# Patient Record
Sex: Female | Born: 1935 | Race: White | Hispanic: No | Smoking: Former smoker
Health system: Southern US, Community
[De-identification: ages and names within clinical notes are randomized; demographics above are authoritative.]

## PROBLEM LIST (undated history)

## (undated) DIAGNOSIS — H8109 Meniere's disease, unspecified ear: Secondary | ICD-10-CM

## (undated) DIAGNOSIS — E785 Hyperlipidemia, unspecified: Secondary | ICD-10-CM

## (undated) DIAGNOSIS — F0280 Dementia in other diseases classified elsewhere without behavioral disturbance: Secondary | ICD-10-CM

## (undated) DIAGNOSIS — G309 Alzheimer's disease, unspecified: Secondary | ICD-10-CM

## (undated) DIAGNOSIS — I4891 Unspecified atrial fibrillation: Secondary | ICD-10-CM

## (undated) DIAGNOSIS — I10 Essential (primary) hypertension: Secondary | ICD-10-CM

## (undated) DIAGNOSIS — K449 Diaphragmatic hernia without obstruction or gangrene: Secondary | ICD-10-CM

## (undated) DIAGNOSIS — D509 Iron deficiency anemia, unspecified: Secondary | ICD-10-CM

## (undated) DIAGNOSIS — K219 Gastro-esophageal reflux disease without esophagitis: Secondary | ICD-10-CM

## (undated) DIAGNOSIS — I509 Heart failure, unspecified: Secondary | ICD-10-CM

## (undated) HISTORY — PX: MASTECTOMY: SHX3

---

## 2004-04-30 ENCOUNTER — Ambulatory Visit: Payer: Self-pay | Admitting: Gastroenterology

## 2004-07-22 ENCOUNTER — Ambulatory Visit: Payer: Self-pay | Admitting: Cardiology

## 2004-11-12 ENCOUNTER — Ambulatory Visit: Payer: Self-pay | Admitting: Internal Medicine

## 2005-08-06 ENCOUNTER — Emergency Department: Payer: Self-pay | Admitting: Emergency Medicine

## 2005-08-06 ENCOUNTER — Other Ambulatory Visit: Payer: Self-pay

## 2005-10-05 ENCOUNTER — Ambulatory Visit: Payer: Self-pay | Admitting: Unknown Physician Specialty

## 2005-10-21 ENCOUNTER — Other Ambulatory Visit: Payer: Self-pay

## 2005-10-28 ENCOUNTER — Inpatient Hospital Stay: Payer: Self-pay | Admitting: Unknown Physician Specialty

## 2005-12-27 ENCOUNTER — Ambulatory Visit: Payer: Self-pay | Admitting: Internal Medicine

## 2007-06-20 ENCOUNTER — Ambulatory Visit: Payer: Self-pay | Admitting: Internal Medicine

## 2007-08-16 IMAGING — CR DG LUMBAR SPINE 2-3V
1 series · 3 of 3 positions shown · non-contrast
Comparison: none

REASON FOR EXAM: INJURY
COMMENTS:  LMP: Post-Menopausal

[Series 1: view not recorded · 0.17mm/px · 3 of 3 slices shown]
[im 1/3]
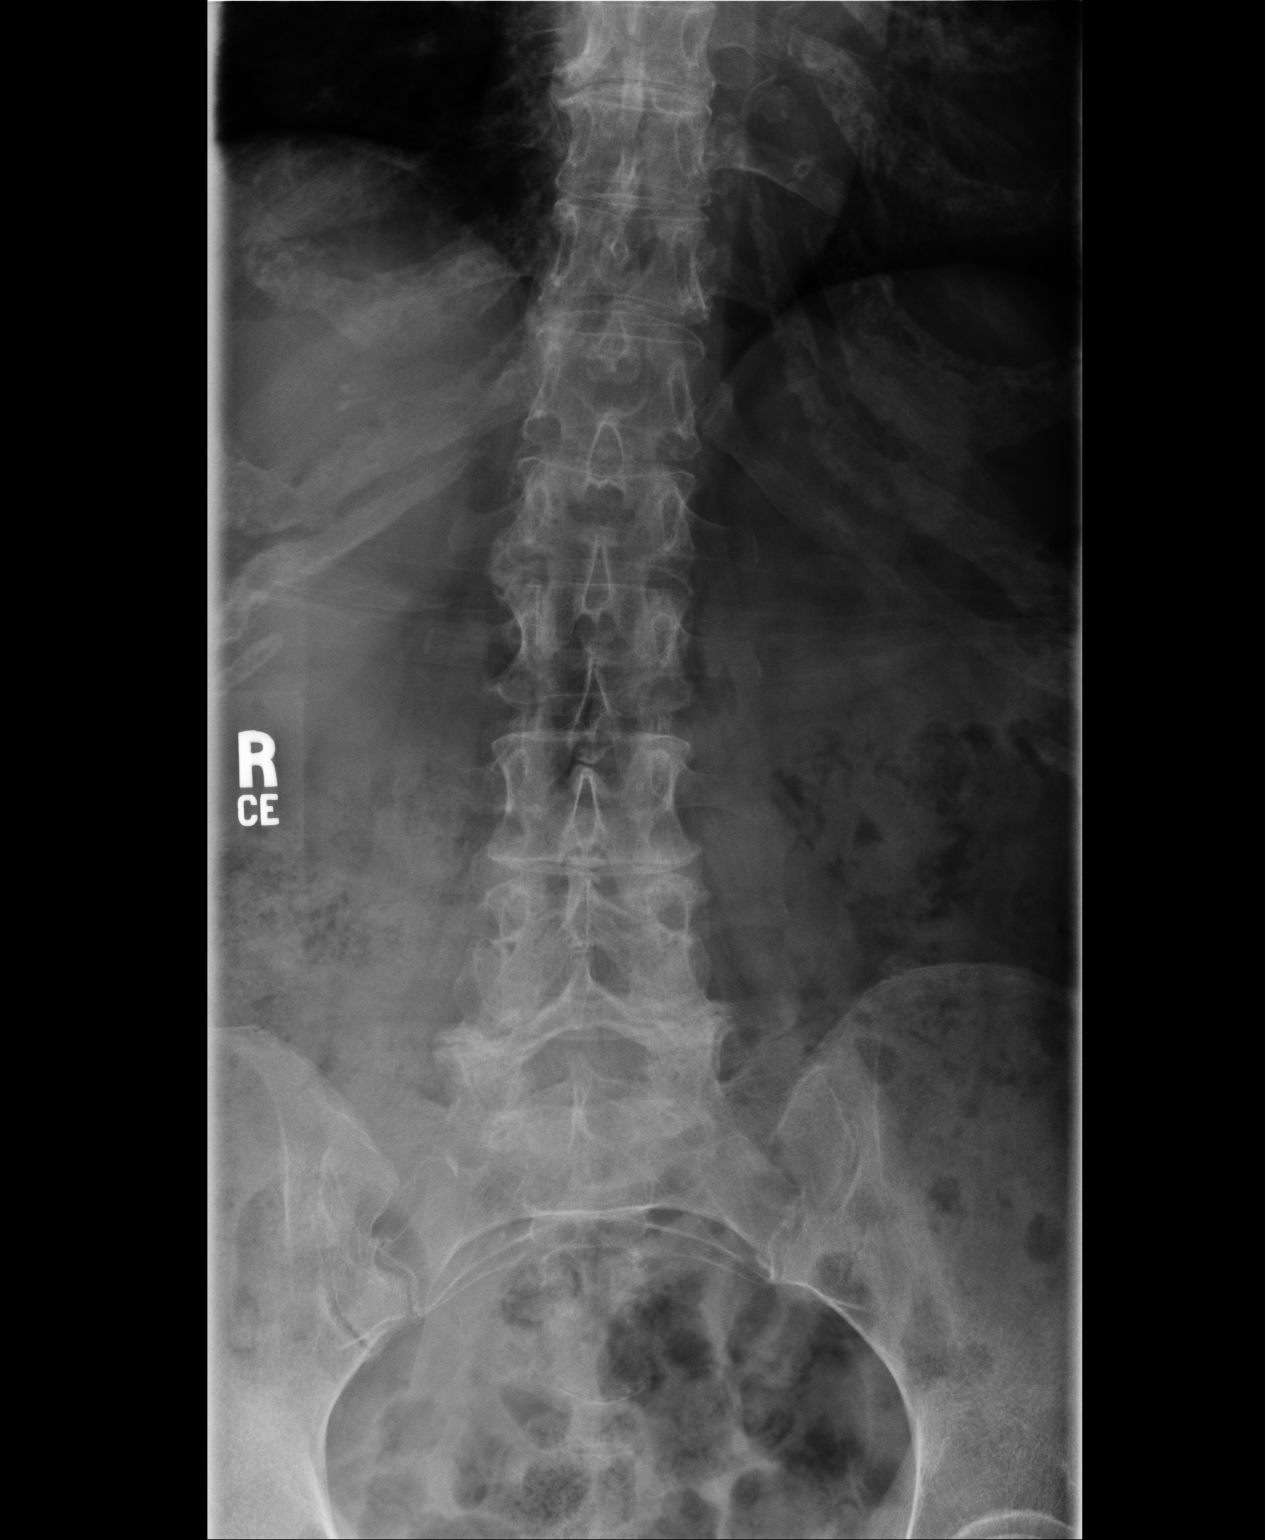
[im 2/3]
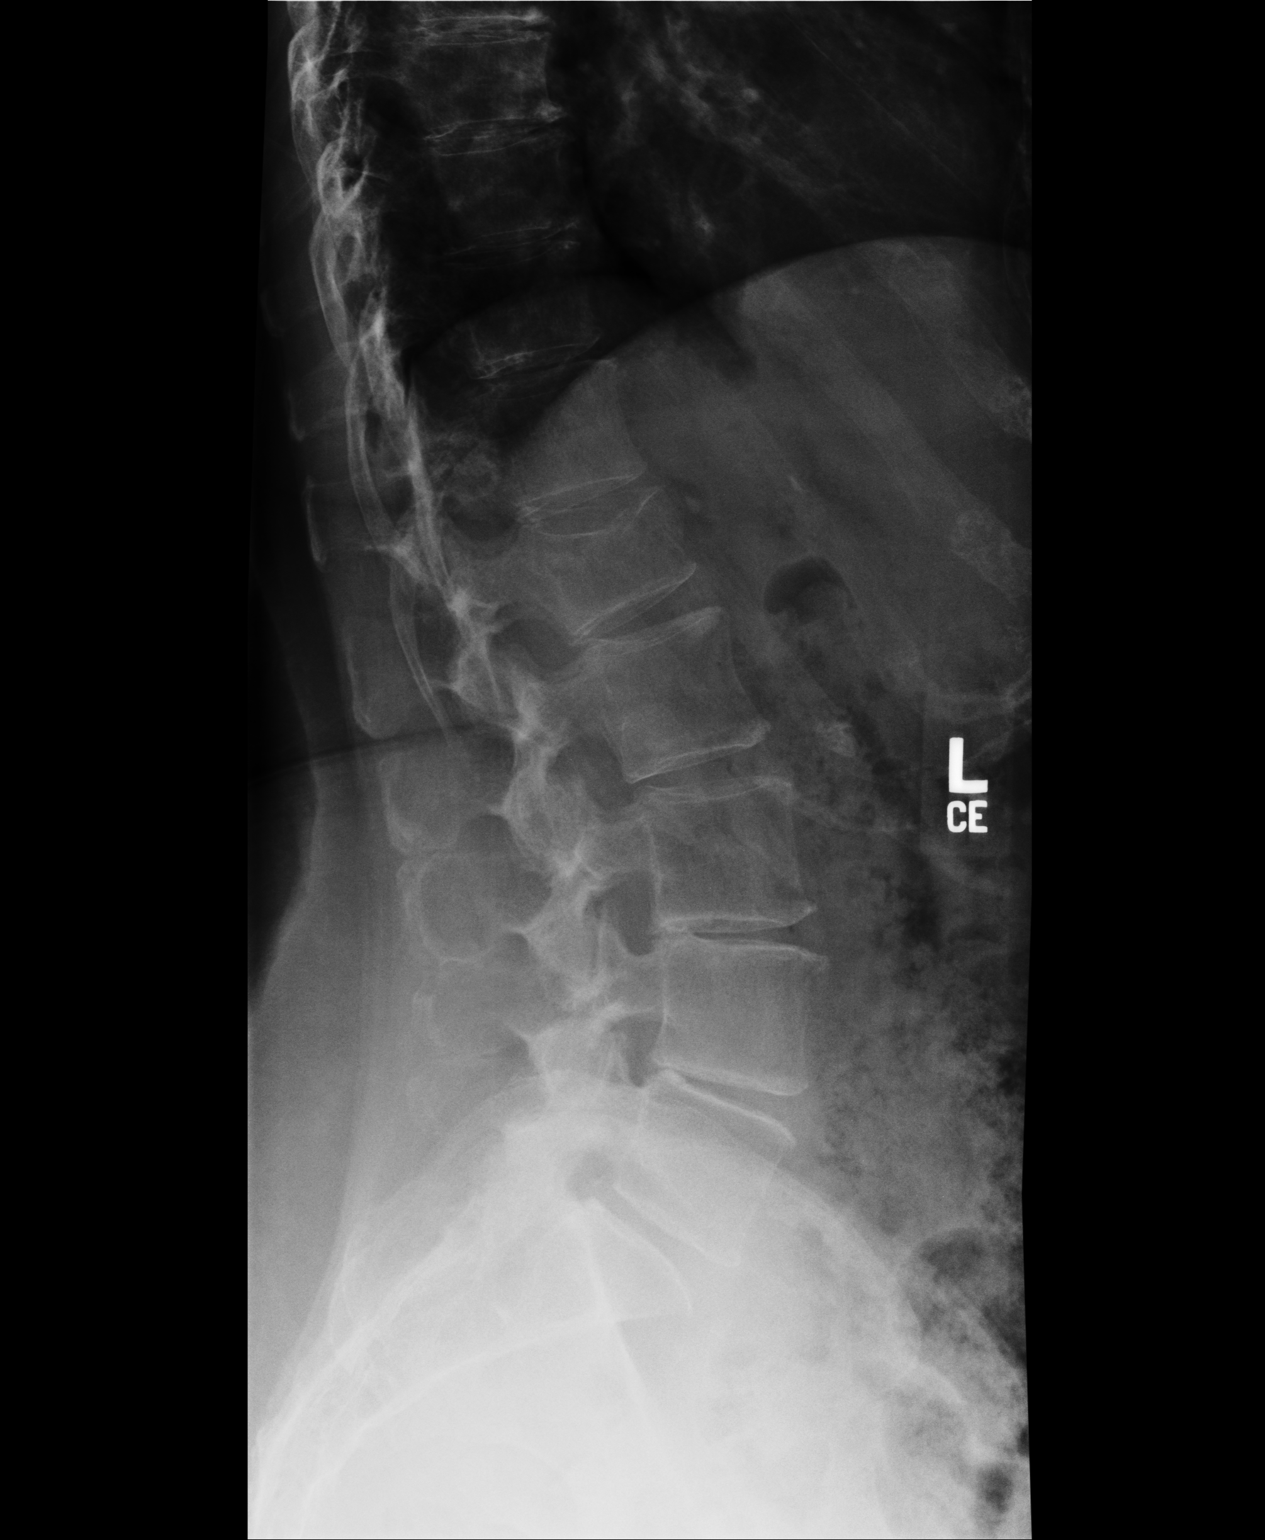
[im 3/3]
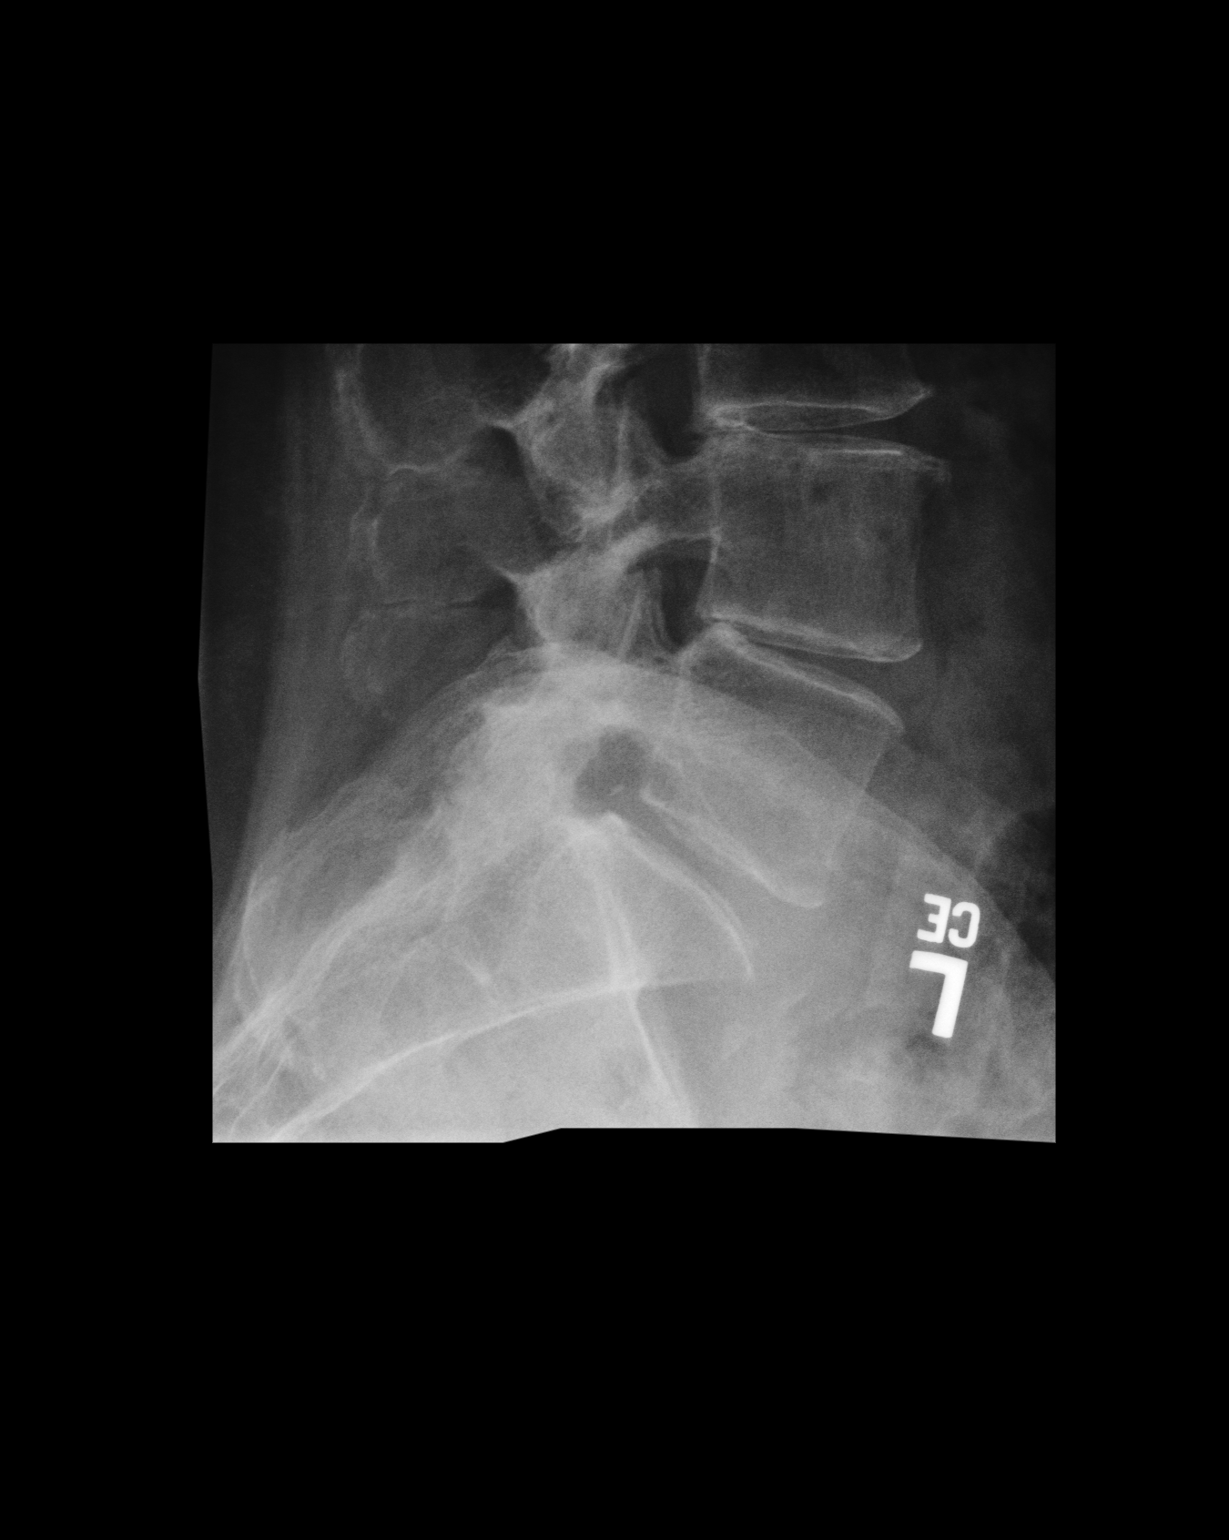

[3 of 3 positions shown; findings below may reference images not displayed]

PROCEDURE:     DXR - DXR LUMBAR SPINE AP AND LATERAL  - August 06, 2005  [DATE]

RESULT:        AP and lateral views of the lumbar spine show a 20% central
and anterior vertebral compression deformity of L1.  The vertebral body
heights otherwise are well maintained.  There is narrowing of the L3-L4,
L4-L5 intervertebral disc spaces consistent with disc disease.  The
vertebral body alignment is normal.  The pedicles are bilaterally intact.
IMPRESSION: 1.     There is a 20% vertebral compression fracture deformity of L1.  The
age of this is not certain but could be acute.
2.     No other area suspicious for fracture are seen.
3.     There is narrowing of the L3-L4 and L4-L5 intervertebral disc spaces
consistent with disc disease.

## 2007-10-31 IMAGING — CR DG CHEST 2V
1 series · 2 of 2 positions shown · non-contrast
Comparison: none

REASON FOR EXAM: HTN
COMMENTS:

PROCEDURE:     DXR - DXR CHEST PA (OR AP) AND LATERAL  - October 21, 2005 [DATE]
RESULT:       The lung fields are clear.  No pneumonia, pneumothorax or
pleural effusion is seen.  The heart is upper limits for normal in size or
slightly enlarged.  No acute bony abnormalities are seen.

[Series 1: view not recorded · 0.17mm/px · 2 of 2 slices shown]
[im 1/2]
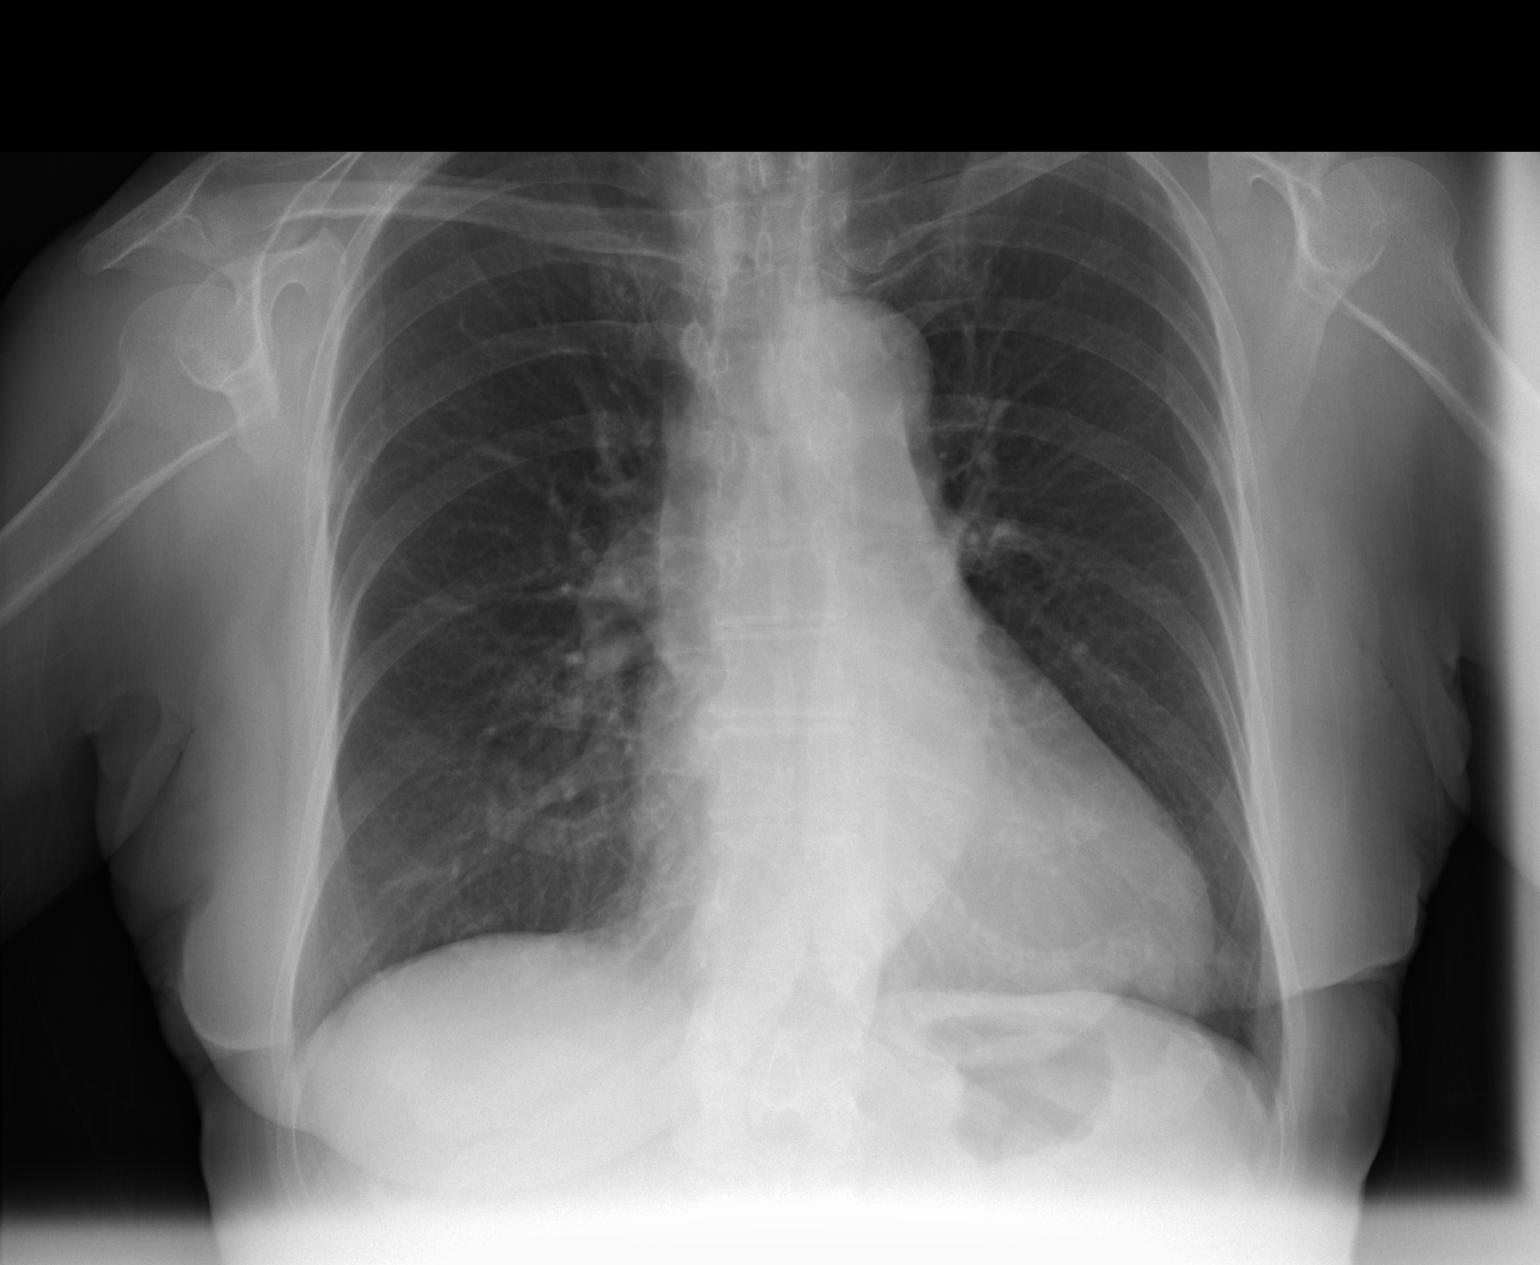
[im 2/2]
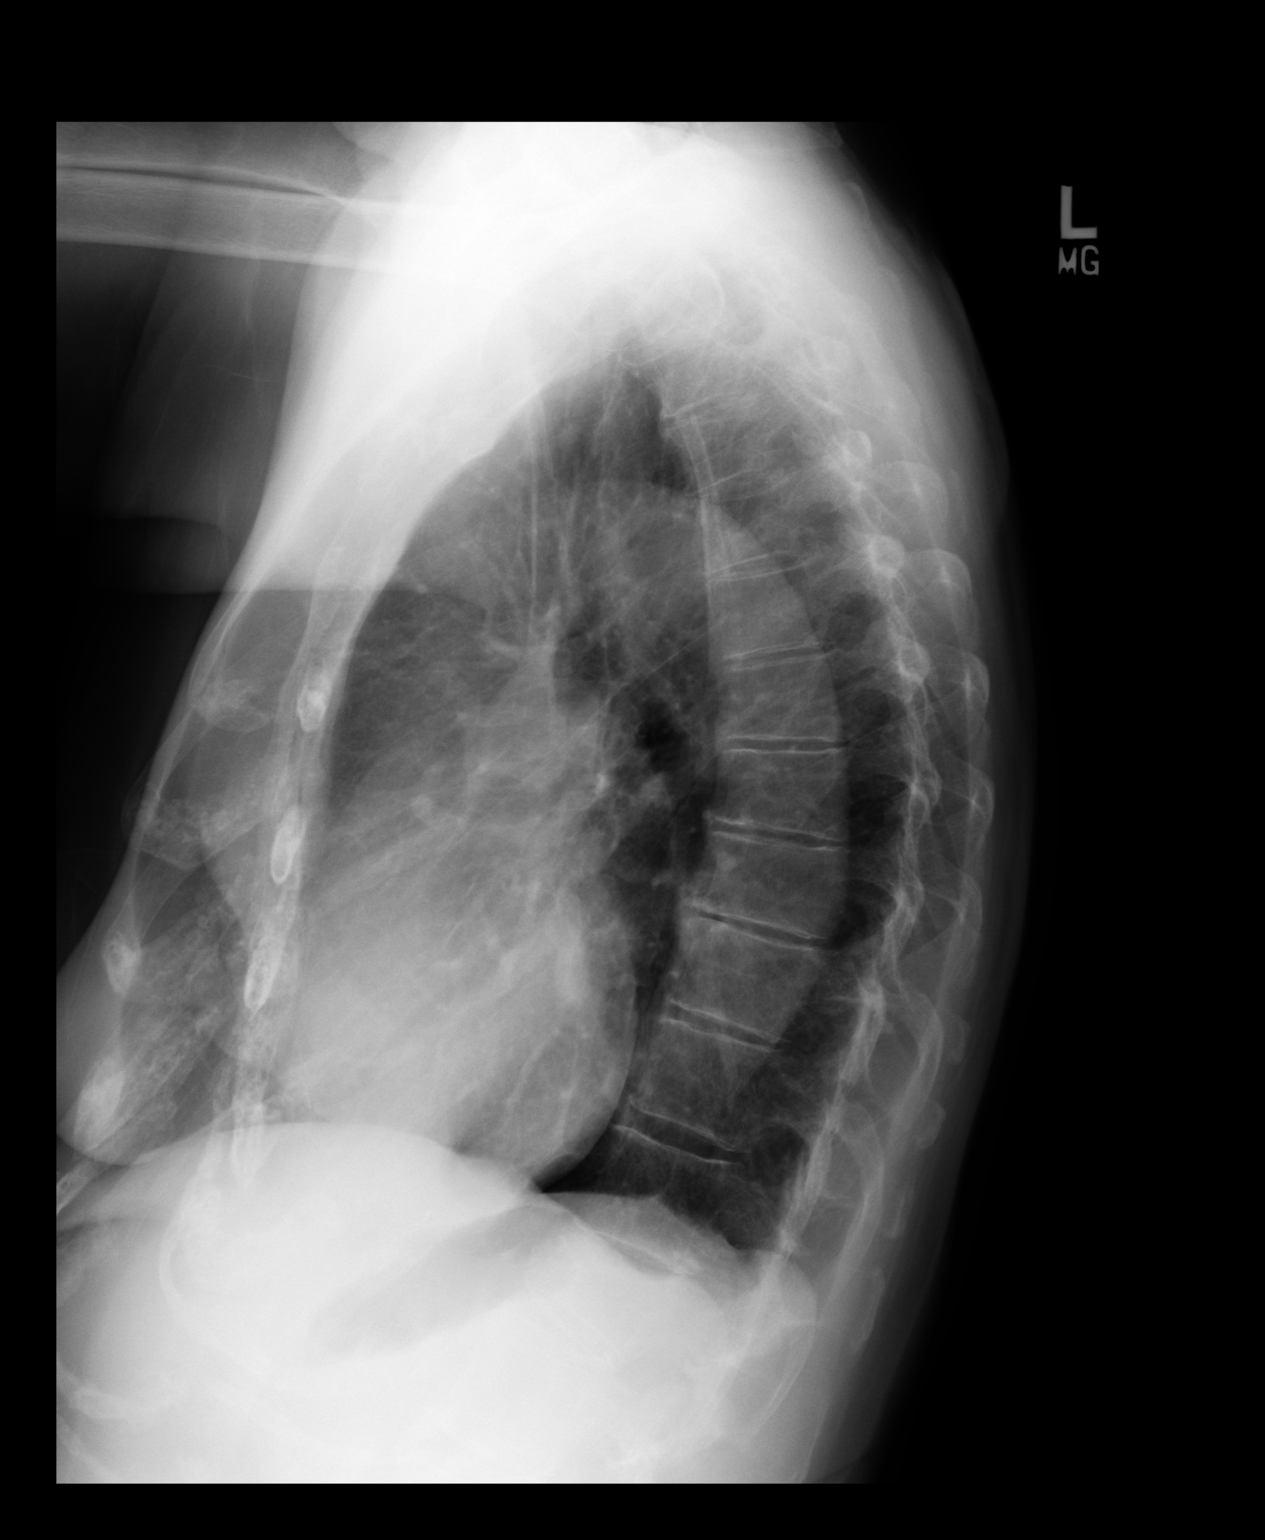

[2 of 2 positions shown; findings below may reference images not displayed]

IMPRESSION: 1.     The lung fields are clear.
2.     The heart is upper limits for normal in size or slightly enlarged.

## 2008-06-20 ENCOUNTER — Ambulatory Visit: Payer: Self-pay | Admitting: Internal Medicine

## 2011-11-04 ENCOUNTER — Ambulatory Visit: Payer: Self-pay | Admitting: Internal Medicine

## 2011-11-04 LAB — CREATININE, SERUM
Creatinine: 0.66 mg/dL (ref 0.60–1.30)
EGFR (African American): >60

## 2012-10-18 ENCOUNTER — Observation Stay: Payer: Self-pay | Admitting: Internal Medicine

## 2012-10-18 LAB — CBC
HCT: 39.9 % (ref 35.0–47.0)
MCH: 29.1 pg (ref 26.0–34.0)
MCV: 85 fL (ref 80–100)
Platelet: 194 10*3/uL (ref 150–440)
RBC: 4.73 10*6/uL (ref 3.80–5.20)
WBC: 7.7 10*3/uL (ref 3.6–11.0)

## 2012-10-18 LAB — COMPREHENSIVE METABOLIC PANEL
Albumin: 4 g/dL (ref 3.4–5.0)
Alkaline Phosphatase: 130 U/L (ref 50–136)
Anion Gap: 4 — ABNORMAL LOW (ref 7–16)
BUN: 9 mg/dL (ref 7–18)
Bilirubin,Total: 0.6 mg/dL (ref 0.2–1.0)
Calcium, Total: 9.8 mg/dL (ref 8.5–10.1)
Creatinine: 0.79 mg/dL (ref 0.60–1.30)
EGFR (African American): >60
Glucose: 93 mg/dL (ref 65–99)
Osmolality: 278 (ref 275–301)
SGOT(AST): 28 U/L (ref 15–37)
Sodium: 140 mmol/L (ref 136–145)

## 2012-10-18 LAB — URINALYSIS, COMPLETE
Glucose,UR: NEGATIVE mg/dL (ref 0–75)
Ketone: NEGATIVE
Ph: 7 (ref 4.5–8.0)
RBC,UR: 3 /HPF (ref 0–5)
Specific Gravity: 1.011 (ref 1.003–1.030)
Squamous Epithelial: NONE SEEN
WBC UR: 30 /HPF (ref 0–5)

## 2012-10-19 LAB — URINE CULTURE

## 2012-11-14 ENCOUNTER — Ambulatory Visit: Payer: Self-pay | Admitting: Family Medicine

## 2013-06-28 ENCOUNTER — Emergency Department: Payer: Self-pay | Admitting: Emergency Medicine

## 2013-06-28 LAB — COMPREHENSIVE METABOLIC PANEL
ALBUMIN: 3.4 g/dL (ref 3.4–5.0)
ANION GAP: 7 (ref 7–16)
AST: 30 U/L (ref 15–37)
Alkaline Phosphatase: 98 U/L
BUN: 14 mg/dL (ref 7–18)
Bilirubin,Total: 0.7 mg/dL (ref 0.2–1.0)
CHLORIDE: 109 mmol/L — AB (ref 98–107)
CO2: 25 mmol/L (ref 21–32)
CREATININE: 0.61 mg/dL (ref 0.60–1.30)
Calcium, Total: 8.8 mg/dL (ref 8.5–10.1)
EGFR (African American): >60
Glucose: 123 mg/dL — ABNORMAL HIGH (ref 65–99)
OSMOLALITY: 283 (ref 275–301)
Potassium: 3.8 mmol/L (ref 3.5–5.1)
SGPT (ALT): 24 U/L (ref 12–78)
Sodium: 141 mmol/L (ref 136–145)
TOTAL PROTEIN: 7.2 g/dL (ref 6.4–8.2)

## 2013-06-28 LAB — URINALYSIS, COMPLETE
BILIRUBIN, UR: NEGATIVE
Bacteria: NONE SEEN
Glucose,UR: NEGATIVE mg/dL (ref 0–75)
Leukocyte Esterase: NEGATIVE
NITRITE: NEGATIVE
Ph: 5 (ref 4.5–8.0)
Protein: 30
RBC,UR: 5 /HPF (ref 0–5)
SPECIFIC GRAVITY: 1.023 (ref 1.003–1.030)
Squamous Epithelial: NONE SEEN

## 2013-06-28 LAB — CBC
HCT: 38.7 % (ref 35.0–47.0)
HGB: 12.4 g/dL (ref 12.0–16.0)
MCH: 27.7 pg (ref 26.0–34.0)
MCHC: 32.1 g/dL (ref 32.0–36.0)
MCV: 86 fL (ref 80–100)
Platelet: 200 10*3/uL (ref 150–440)
RBC: 4.49 10*6/uL (ref 3.80–5.20)
RDW: 14.4 % (ref 11.5–14.5)
WBC: 11.2 10*3/uL — AB (ref 3.6–11.0)

## 2013-06-28 LAB — TROPONIN I

## 2013-09-11 LAB — TROPONIN I: Troponin-I: 0.04 ng/mL

## 2013-09-11 LAB — CBC
HCT: 40.1 % (ref 35.0–47.0)
HGB: 12.5 g/dL (ref 12.0–16.0)
MCH: 26.4 pg (ref 26.0–34.0)
MCHC: 31.2 g/dL — AB (ref 32.0–36.0)
MCV: 84 fL (ref 80–100)
Platelet: 198 10*3/uL (ref 150–440)
RBC: 4.75 10*6/uL (ref 3.80–5.20)
RDW: 15.2 % — ABNORMAL HIGH (ref 11.5–14.5)
WBC: 10.4 10*3/uL (ref 3.6–11.0)

## 2013-09-11 LAB — COMPREHENSIVE METABOLIC PANEL
ALT: 33 U/L (ref 12–78)
AST: 42 U/L — AB (ref 15–37)
Albumin: 2.9 g/dL — ABNORMAL LOW (ref 3.4–5.0)
Alkaline Phosphatase: 102 U/L
Anion Gap: 5 — ABNORMAL LOW (ref 7–16)
BUN: 26 mg/dL — ABNORMAL HIGH (ref 7–18)
Bilirubin,Total: 0.6 mg/dL (ref 0.2–1.0)
CO2: 30 mmol/L (ref 21–32)
CREATININE: 0.85 mg/dL (ref 0.60–1.30)
Calcium, Total: 8.9 mg/dL (ref 8.5–10.1)
Chloride: 104 mmol/L (ref 98–107)
EGFR (African American): >60
Glucose: 114 mg/dL — ABNORMAL HIGH (ref 65–99)
Osmolality: 283 (ref 275–301)
Potassium: 3.5 mmol/L (ref 3.5–5.1)
Sodium: 139 mmol/L (ref 136–145)
Total Protein: 6.8 g/dL (ref 6.4–8.2)

## 2013-09-11 LAB — CK TOTAL AND CKMB (NOT AT ARMC)
CK, TOTAL: 244 U/L — AB
CK-MB: 7.1 ng/mL — ABNORMAL HIGH (ref 0.5–3.6)

## 2013-09-12 ENCOUNTER — Observation Stay: Payer: Self-pay | Admitting: Internal Medicine

## 2013-09-12 LAB — CBC WITH DIFFERENTIAL/PLATELET
Basophil #: 0.1 10*3/uL (ref 0.0–0.1)
Basophil %: 0.6 %
Eosinophil #: 0 10*3/uL (ref 0.0–0.7)
Eosinophil %: 0.6 %
HCT: 35.8 % (ref 35.0–47.0)
HGB: 11.3 g/dL — ABNORMAL LOW (ref 12.0–16.0)
LYMPHS ABS: 1 10*3/uL (ref 1.0–3.6)
Lymphocyte %: 12.2 %
MCH: 26.5 pg (ref 26.0–34.0)
MCHC: 31.6 g/dL — AB (ref 32.0–36.0)
MCV: 84 fL (ref 80–100)
MONO ABS: 0.8 x10 3/mm (ref 0.2–0.9)
Monocyte %: 10.5 %
NEUTROS ABS: 6 10*3/uL (ref 1.4–6.5)
Neutrophil %: 76.1 %
Platelet: 168 10*3/uL (ref 150–440)
RBC: 4.25 10*6/uL (ref 3.80–5.20)
RDW: 15 % — ABNORMAL HIGH (ref 11.5–14.5)
WBC: 7.9 10*3/uL (ref 3.6–11.0)

## 2013-09-12 LAB — URINALYSIS, COMPLETE
BILIRUBIN, UR: NEGATIVE
Bacteria: NONE SEEN
Glucose,UR: NEGATIVE mg/dL (ref 0–75)
Ketone: NEGATIVE
Nitrite: NEGATIVE
PH: 6 (ref 4.5–8.0)
Protein: NEGATIVE
Specific Gravity: 1.003 (ref 1.003–1.030)
Squamous Epithelial: <1

## 2013-09-12 LAB — LIPID PANEL
CHOLESTEROL: 117 mg/dL (ref 0–200)
HDL: 54 mg/dL (ref 40–60)
LDL CHOLESTEROL, CALC: 49 mg/dL (ref 0–100)
Triglycerides: 72 mg/dL (ref 0–200)
VLDL Cholesterol, Calc: 14 mg/dL (ref 5–40)

## 2013-09-12 LAB — BASIC METABOLIC PANEL
ANION GAP: 8 (ref 7–16)
BUN: 21 mg/dL — ABNORMAL HIGH (ref 7–18)
CHLORIDE: 106 mmol/L (ref 98–107)
CO2: 30 mmol/L (ref 21–32)
CREATININE: 0.82 mg/dL (ref 0.60–1.30)
Calcium, Total: 8.6 mg/dL (ref 8.5–10.1)
GLUCOSE: 103 mg/dL — AB (ref 65–99)
Osmolality: 290 (ref 275–301)
Potassium: 3.5 mmol/L (ref 3.5–5.1)
Sodium: 144 mmol/L (ref 136–145)

## 2013-09-12 LAB — CK TOTAL AND CKMB (NOT AT ARMC)
CK, TOTAL: 253 U/L — AB
CK, Total: 209 U/L — ABNORMAL HIGH
CK-MB: 5.8 ng/mL — ABNORMAL HIGH (ref 0.5–3.6)
CK-MB: 4.8 ng/mL — ABNORMAL HIGH (ref 0.5–3.6)

## 2013-09-12 LAB — MAGNESIUM: MAGNESIUM: 1.9 mg/dL

## 2013-09-12 LAB — TSH: Thyroid Stimulating Horm: 2.63 u[IU]/mL

## 2013-09-12 LAB — TROPONIN I
TROPONIN-I: 0.03 ng/mL
TROPONIN-I: 0.06 ng/mL — AB

## 2013-09-13 LAB — CK: CK, Total: 155 U/L

## 2013-09-14 LAB — URINE CULTURE

## 2013-09-18 LAB — WOUND CULTURE

## 2013-11-06 ENCOUNTER — Ambulatory Visit: Payer: Self-pay | Admitting: Hospice and Palliative Medicine

## 2013-11-07 ENCOUNTER — Inpatient Hospital Stay: Payer: Self-pay | Admitting: Internal Medicine

## 2013-11-07 LAB — COMPREHENSIVE METABOLIC PANEL
ALBUMIN: 2.4 g/dL — AB (ref 3.4–5.0)
Alkaline Phosphatase: 141 U/L — ABNORMAL HIGH
Anion Gap: 7 (ref 7–16)
BILIRUBIN TOTAL: 0.9 mg/dL (ref 0.2–1.0)
BUN: 22 mg/dL — ABNORMAL HIGH (ref 7–18)
CALCIUM: 8.4 mg/dL — AB (ref 8.5–10.1)
CHLORIDE: 101 mmol/L (ref 98–107)
CREATININE: 0.92 mg/dL (ref 0.60–1.30)
Co2: 25 mmol/L (ref 21–32)
EGFR (African American): >60
EGFR (Non-African Amer.): 57 — ABNORMAL LOW
Glucose: 112 mg/dL — ABNORMAL HIGH (ref 65–99)
OSMOLALITY: 270 (ref 275–301)
Potassium: 3.3 mmol/L — ABNORMAL LOW (ref 3.5–5.1)
SGOT(AST): 24 U/L (ref 15–37)
SGPT (ALT): 22 U/L
Sodium: 133 mmol/L — ABNORMAL LOW (ref 136–145)
Total Protein: 6.5 g/dL (ref 6.4–8.2)

## 2013-11-07 LAB — PRO B NATRIURETIC PEPTIDE: B-TYPE NATIURETIC PEPTID: 6790 pg/mL — AB (ref 0–450)

## 2013-11-07 LAB — CK-MB
CK-MB: 4.6 ng/mL — ABNORMAL HIGH (ref 0.5–3.6)
CK-MB: 5.9 ng/mL — AB (ref 0.5–3.6)

## 2013-11-07 LAB — CBC
HCT: 41.5 % (ref 35.0–47.0)
HGB: 13.5 g/dL (ref 12.0–16.0)
MCH: 26.4 pg (ref 26.0–34.0)
MCHC: 32.4 g/dL (ref 32.0–36.0)
MCV: 82 fL (ref 80–100)
Platelet: 152 10*3/uL (ref 150–440)
RBC: 5.09 10*6/uL (ref 3.80–5.20)
RDW: 15.8 % — AB (ref 11.5–14.5)
WBC: 10.7 10*3/uL (ref 3.6–11.0)

## 2013-11-07 LAB — DIGOXIN LEVEL: Digoxin: 0.14 ng/mL

## 2013-11-07 LAB — MAGNESIUM: Magnesium: 1.8 mg/dL

## 2013-11-07 LAB — TROPONIN I
Troponin-I: 0.02 ng/mL
Troponin-I: <0.02 ng/mL

## 2013-11-07 LAB — TSH: THYROID STIMULATING HORM: 1.72 u[IU]/mL

## 2013-11-08 LAB — COMPREHENSIVE METABOLIC PANEL
ALBUMIN: 1.9 g/dL — AB (ref 3.4–5.0)
Alkaline Phosphatase: 102 U/L
Anion Gap: 7 (ref 7–16)
BUN: 21 mg/dL — ABNORMAL HIGH (ref 7–18)
Bilirubin,Total: 0.7 mg/dL (ref 0.2–1.0)
Calcium, Total: 7.6 mg/dL — ABNORMAL LOW (ref 8.5–10.1)
Chloride: 103 mmol/L (ref 98–107)
Co2: 24 mmol/L (ref 21–32)
Creatinine: 0.87 mg/dL (ref 0.60–1.30)
EGFR (Non-African Amer.): >60
Glucose: 123 mg/dL — ABNORMAL HIGH (ref 65–99)
Osmolality: 273 (ref 275–301)
POTASSIUM: 3.1 mmol/L — AB (ref 3.5–5.1)
SGOT(AST): 20 U/L (ref 15–37)
SGPT (ALT): 18 U/L
Sodium: 134 mmol/L — ABNORMAL LOW (ref 136–145)
Total Protein: 5.2 g/dL — ABNORMAL LOW (ref 6.4–8.2)

## 2013-11-08 LAB — URINALYSIS, COMPLETE
Bacteria: NONE SEEN
Bilirubin,UR: NEGATIVE
Blood: NEGATIVE
Glucose,UR: NEGATIVE mg/dL (ref 0–75)
Hyaline Cast: 1
KETONE: NEGATIVE
Leukocyte Esterase: NEGATIVE
NITRITE: NEGATIVE
Ph: 5 (ref 4.5–8.0)
Protein: NEGATIVE
Specific Gravity: 1.005 (ref 1.003–1.030)
Squamous Epithelial: <1

## 2013-11-08 LAB — CK TOTAL AND CKMB (NOT AT ARMC)
CK, TOTAL: 46 U/L
CK, TOTAL: 39 U/L
CK-MB: 8 ng/mL — ABNORMAL HIGH (ref 0.5–3.6)
CK-MB: 7.6 ng/mL — ABNORMAL HIGH (ref 0.5–3.6)

## 2013-11-08 LAB — CBC WITH DIFFERENTIAL/PLATELET
Basophil #: 0 10*3/uL (ref 0.0–0.1)
Basophil %: 0.1 %
Eosinophil #: 0 10*3/uL (ref 0.0–0.7)
Eosinophil %: 0 %
HCT: 33.7 % — ABNORMAL LOW (ref 35.0–47.0)
HGB: 10.5 g/dL — ABNORMAL LOW (ref 12.0–16.0)
Lymphocyte #: 0.4 10*3/uL — ABNORMAL LOW (ref 1.0–3.6)
Lymphocyte %: 5.9 %
MCH: 25.7 pg — AB (ref 26.0–34.0)
MCHC: 31.2 g/dL — ABNORMAL LOW (ref 32.0–36.0)
MCV: 82 fL (ref 80–100)
Monocyte #: 0.7 x10 3/mm (ref 0.2–0.9)
Monocyte %: 9.8 %
NEUTROS PCT: 84.2 %
Neutrophil #: 5.7 10*3/uL (ref 1.4–6.5)
Platelet: 115 10*3/uL — ABNORMAL LOW (ref 150–440)
RBC: 4.1 10*6/uL (ref 3.80–5.20)
RDW: 15.6 % — AB (ref 11.5–14.5)
WBC: 6.8 10*3/uL (ref 3.6–11.0)

## 2013-11-08 LAB — TROPONIN I
TROPONIN-I: 0.02 ng/mL
Troponin-I: 0.03 ng/mL
Troponin-I: 0.02 ng/mL

## 2013-11-08 LAB — CK-MB: CK-MB: 5.9 ng/mL — AB (ref 0.5–3.6)

## 2013-11-09 LAB — BASIC METABOLIC PANEL
Anion Gap: 8 (ref 7–16)
BUN: 20 mg/dL — AB (ref 7–18)
CHLORIDE: 105 mmol/L (ref 98–107)
CREATININE: 0.94 mg/dL (ref 0.60–1.30)
Calcium, Total: 8.4 mg/dL — ABNORMAL LOW (ref 8.5–10.1)
Co2: 25 mmol/L (ref 21–32)
EGFR (African American): >60
GFR CALC NON AF AMER: 56 — AB
Glucose: 170 mg/dL — ABNORMAL HIGH (ref 65–99)
Osmolality: 282 (ref 275–301)
Potassium: 4 mmol/L (ref 3.5–5.1)
Sodium: 138 mmol/L (ref 136–145)

## 2013-11-09 LAB — CBC WITH DIFFERENTIAL/PLATELET
BASOS PCT: 0.2 %
Basophil #: 0 10*3/uL (ref 0.0–0.1)
EOS ABS: 0 10*3/uL (ref 0.0–0.7)
Eosinophil %: 0 %
HCT: 38.8 % (ref 35.0–47.0)
HGB: 12.1 g/dL (ref 12.0–16.0)
LYMPHS PCT: 2 %
Lymphocyte #: 0.2 10*3/uL — ABNORMAL LOW (ref 1.0–3.6)
MCH: 25.6 pg — ABNORMAL LOW (ref 26.0–34.0)
MCHC: 31.2 g/dL — AB (ref 32.0–36.0)
MCV: 82 fL (ref 80–100)
Monocyte #: 0.4 x10 3/mm (ref 0.2–0.9)
Monocyte %: 3.1 %
NEUTROS ABS: 10.9 10*3/uL — AB (ref 1.4–6.5)
NEUTROS PCT: 94.7 %
PLATELETS: 146 10*3/uL — AB (ref 150–440)
RBC: 4.74 10*6/uL (ref 3.80–5.20)
RDW: 15.8 % — ABNORMAL HIGH (ref 11.5–14.5)
WBC: 11.6 10*3/uL — ABNORMAL HIGH (ref 3.6–11.0)

## 2013-11-09 LAB — MAGNESIUM: Magnesium: 1.8 mg/dL

## 2013-11-09 LAB — PHOSPHORUS: Phosphorus: 3.2 mg/dL (ref 2.5–4.9)

## 2013-11-09 LAB — TRIGLYCERIDES: TRIGLYCERIDES: 64 mg/dL (ref 0–200)

## 2013-11-10 LAB — CBC WITH DIFFERENTIAL/PLATELET
Basophil #: 0 10*3/uL (ref 0.0–0.1)
Basophil %: 0.1 %
EOS ABS: 0 10*3/uL (ref 0.0–0.7)
Eosinophil %: 0 %
HCT: 37.2 % (ref 35.0–47.0)
HGB: 12 g/dL (ref 12.0–16.0)
LYMPHS ABS: 0.4 10*3/uL — AB (ref 1.0–3.6)
LYMPHS PCT: 3.5 %
MCH: 26.3 pg (ref 26.0–34.0)
MCHC: 32.4 g/dL (ref 32.0–36.0)
MCV: 81 fL (ref 80–100)
Monocyte #: 0.8 x10 3/mm (ref 0.2–0.9)
Monocyte %: 7.4 %
Neutrophil #: 9.3 10*3/uL — ABNORMAL HIGH (ref 1.4–6.5)
Neutrophil %: 89 %
Platelet: 137 10*3/uL — ABNORMAL LOW (ref 150–440)
RBC: 4.57 10*6/uL (ref 3.80–5.20)
RDW: 15.7 % — ABNORMAL HIGH (ref 11.5–14.5)
WBC: 10.4 10*3/uL (ref 3.6–11.0)

## 2013-11-10 LAB — BASIC METABOLIC PANEL
ANION GAP: 8 (ref 7–16)
BUN: 21 mg/dL — AB (ref 7–18)
Calcium, Total: 8.4 mg/dL — ABNORMAL LOW (ref 8.5–10.1)
Chloride: 107 mmol/L (ref 98–107)
Co2: 27 mmol/L (ref 21–32)
Creatinine: 1.1 mg/dL (ref 0.60–1.30)
EGFR (African American): 53 — ABNORMAL LOW
EGFR (Non-African Amer.): 46 — ABNORMAL LOW
Glucose: 86 mg/dL (ref 65–99)
Osmolality: 285 (ref 275–301)
Potassium: 4.1 mmol/L (ref 3.5–5.1)
Sodium: 142 mmol/L (ref 136–145)

## 2013-11-10 LAB — URINE CULTURE

## 2013-11-11 LAB — BASIC METABOLIC PANEL
Anion Gap: 8 (ref 7–16)
BUN: 20 mg/dL — AB (ref 7–18)
CALCIUM: 8 mg/dL — AB (ref 8.5–10.1)
CHLORIDE: 102 mmol/L (ref 98–107)
Co2: 30 mmol/L (ref 21–32)
Creatinine: 1.18 mg/dL (ref 0.60–1.30)
EGFR (African American): 49 — ABNORMAL LOW
GFR CALC NON AF AMER: 42 — AB
Glucose: 138 mg/dL — ABNORMAL HIGH (ref 65–99)
OSMOLALITY: 284 (ref 275–301)
POTASSIUM: 3.6 mmol/L (ref 3.5–5.1)
Sodium: 140 mmol/L (ref 136–145)

## 2013-11-11 LAB — MAGNESIUM: Magnesium: 1.9 mg/dL

## 2013-11-11 LAB — EXPECTORATED SPUTUM ASSESSMENT W REFEX TO RESP CULTURE

## 2013-11-11 LAB — PHOSPHORUS: Phosphorus: 3.4 mg/dL (ref 2.5–4.9)

## 2013-11-12 LAB — EXPECTORATED SPUTUM ASSESSMENT W REFEX TO RESP CULTURE

## 2013-11-12 LAB — BASIC METABOLIC PANEL
Anion Gap: 8 (ref 7–16)
BUN: 20 mg/dL — AB (ref 7–18)
Calcium, Total: 8.6 mg/dL (ref 8.5–10.1)
Chloride: 100 mmol/L (ref 98–107)
Co2: 31 mmol/L (ref 21–32)
Creatinine: 1.05 mg/dL (ref 0.60–1.30)
EGFR (African American): 56 — ABNORMAL LOW
GFR CALC NON AF AMER: 49 — AB
GLUCOSE: 65 mg/dL (ref 65–99)
Osmolality: 278 (ref 275–301)
POTASSIUM: 3.2 mmol/L — AB (ref 3.5–5.1)
Sodium: 139 mmol/L (ref 136–145)

## 2013-11-12 LAB — PHOSPHORUS: Phosphorus: 3.4 mg/dL (ref 2.5–4.9)

## 2013-11-12 LAB — MAGNESIUM: Magnesium: 1.8 mg/dL

## 2013-11-13 LAB — CBC WITH DIFFERENTIAL/PLATELET
Basophil #: 0 10*3/uL (ref 0.0–0.1)
Basophil %: 0.1 %
EOS ABS: 0.1 10*3/uL (ref 0.0–0.7)
EOS PCT: 1.2 %
HCT: 38.2 % (ref 35.0–47.0)
HGB: 11.9 g/dL — AB (ref 12.0–16.0)
LYMPHS PCT: 7.5 %
Lymphocyte #: 0.8 10*3/uL — ABNORMAL LOW (ref 1.0–3.6)
MCH: 25.4 pg — AB (ref 26.0–34.0)
MCHC: 31.2 g/dL — ABNORMAL LOW (ref 32.0–36.0)
MCV: 82 fL (ref 80–100)
MONOS PCT: 3.1 %
Monocyte #: 0.3 x10 3/mm (ref 0.2–0.9)
Neutrophil #: 9.6 10*3/uL — ABNORMAL HIGH (ref 1.4–6.5)
Neutrophil %: 88.1 %
Platelet: 129 10*3/uL — ABNORMAL LOW (ref 150–440)
RBC: 4.68 10*6/uL (ref 3.80–5.20)
RDW: 15.4 % — AB (ref 11.5–14.5)
WBC: 10.9 10*3/uL (ref 3.6–11.0)

## 2013-11-13 LAB — BASIC METABOLIC PANEL
Anion Gap: 8 (ref 7–16)
BUN: 20 mg/dL — ABNORMAL HIGH (ref 7–18)
CALCIUM: 8.2 mg/dL — AB (ref 8.5–10.1)
CHLORIDE: 95 mmol/L — AB (ref 98–107)
Co2: 37 mmol/L — ABNORMAL HIGH (ref 21–32)
Creatinine: 1.06 mg/dL (ref 0.60–1.30)
EGFR (African American): 56 — ABNORMAL LOW
EGFR (Non-African Amer.): 48 — ABNORMAL LOW
Glucose: 78 mg/dL (ref 65–99)
OSMOLALITY: 281 (ref 275–301)
POTASSIUM: 3 mmol/L — AB (ref 3.5–5.1)
SODIUM: 140 mmol/L (ref 136–145)

## 2013-11-13 LAB — PHOSPHORUS: PHOSPHORUS: 2.6 mg/dL (ref 2.5–4.9)

## 2013-11-13 LAB — VANCOMYCIN, TROUGH: VANCOMYCIN, TROUGH: 26 ug/mL — AB (ref 10–20)

## 2013-11-13 LAB — MAGNESIUM: Magnesium: 2.2 mg/dL

## 2013-11-14 LAB — CBC WITH DIFFERENTIAL/PLATELET
Basophil #: 0 10*3/uL (ref 0.0–0.1)
Basophil %: 0.2 %
Eosinophil #: 0.1 10*3/uL (ref 0.0–0.7)
Eosinophil %: 0.6 %
HCT: 37.5 % (ref 35.0–47.0)
HGB: 11.9 g/dL — ABNORMAL LOW (ref 12.0–16.0)
LYMPHS ABS: 0.7 10*3/uL — AB (ref 1.0–3.6)
Lymphocyte %: 7.3 %
MCH: 25.7 pg — ABNORMAL LOW (ref 26.0–34.0)
MCHC: 31.6 g/dL — AB (ref 32.0–36.0)
MCV: 81 fL (ref 80–100)
Monocyte #: 0.6 x10 3/mm (ref 0.2–0.9)
Monocyte %: 5.5 %
NEUTROS ABS: 8.8 10*3/uL — AB (ref 1.4–6.5)
NEUTROS PCT: 86.4 %
PLATELETS: 133 10*3/uL — AB (ref 150–440)
RBC: 4.61 10*6/uL (ref 3.80–5.20)
RDW: 15.7 % — ABNORMAL HIGH (ref 11.5–14.5)
WBC: 10.2 10*3/uL (ref 3.6–11.0)

## 2013-11-14 LAB — BASIC METABOLIC PANEL
Anion Gap: 6 — ABNORMAL LOW (ref 7–16)
BUN: 17 mg/dL (ref 7–18)
CO2: 39 mmol/L — AB (ref 21–32)
Calcium, Total: 8.1 mg/dL — ABNORMAL LOW (ref 8.5–10.1)
Chloride: 94 mmol/L — ABNORMAL LOW (ref 98–107)
Creatinine: 1.01 mg/dL (ref 0.60–1.30)
EGFR (Non-African Amer.): 51 — ABNORMAL LOW
GFR CALC AF AMER: 59 — AB
Glucose: 80 mg/dL (ref 65–99)
Osmolality: 278 (ref 275–301)
Potassium: 3.1 mmol/L — ABNORMAL LOW (ref 3.5–5.1)
SODIUM: 139 mmol/L (ref 136–145)

## 2013-11-14 LAB — MAGNESIUM: Magnesium: 2 mg/dL

## 2013-11-14 LAB — PHOSPHORUS: Phosphorus: 2.5 mg/dL (ref 2.5–4.9)

## 2013-11-14 LAB — POTASSIUM
Potassium: 3.3 mmol/L — ABNORMAL LOW (ref 3.5–5.1)
Potassium: 3.6 mmol/L (ref 3.5–5.1)

## 2013-11-15 LAB — BASIC METABOLIC PANEL
Anion Gap: 5 — ABNORMAL LOW (ref 7–16)
BUN: 17 mg/dL (ref 7–18)
CO2: 39 mmol/L — AB (ref 21–32)
Calcium, Total: 8.1 mg/dL — ABNORMAL LOW (ref 8.5–10.1)
Chloride: 96 mmol/L — ABNORMAL LOW (ref 98–107)
Creatinine: 0.89 mg/dL (ref 0.60–1.30)
EGFR (African American): >60
GFR CALC NON AF AMER: 60 — AB
Glucose: 79 mg/dL (ref 65–99)
Osmolality: 280 (ref 275–301)
POTASSIUM: 3.3 mmol/L — AB (ref 3.5–5.1)
Sodium: 140 mmol/L (ref 136–145)

## 2013-11-15 LAB — DIGOXIN LEVEL: DIGOXIN: 2.7 ng/mL — AB

## 2013-12-06 ENCOUNTER — Ambulatory Visit: Payer: Self-pay | Admitting: Hospice and Palliative Medicine

## 2014-02-14 ENCOUNTER — Ambulatory Visit: Payer: Self-pay | Admitting: Family Medicine

## 2014-06-28 NOTE — Discharge Summary (Signed)
PATIENT NAME:  Abigail Dougherty, Abigail B MR#:  [id number] DATE OF BIRTH:  12-16-1927  DATE OF ADMISSION:  10/18/2012 DATE OF DISCHARGE:  10/19/2012  DISCHARGE DIAGNOSES:  1. Ileus due to underlying asymptomatic urinary tract infection, now resolved.  2. Urinary tract infection based on urinalysis, urine culture contaminated.   SECONDARY DIAGNOSES:  1. Hypertension.  2. Dementia.   CONSULTATION: Surgery, Dr. Anda KraftMarterre.   PROCEDURES AND RADIOLOGY: CT scan of the abdomen and pelvis with contrast on 13th of August was concerning for small bowel obstruction, although evaluation by surgery, Dr. Anda KraftMarterre, showed only mildly dilated bowel loops up to 3 cm maximum and felt to be more of an ileus than bowel obstruction. KUB on the 14th of August was unremarkable.   MAJOR LABORATORY PANEL: UA on admission showed 30 WBCs, 1+ bacteria, 3+ leuk esterase.   Urine culture was contaminated.   HISTORY AND SHORT HOSPITAL COURSE: The patient is an 81 year old female with the above-mentioned medical problems who was admitted for possible ileus. There was some concern for small bowel obstruction, although surgical consult was obtained with Dr. Anda KraftMarterre who felt this was only ileus as the patient did not meet any clinical or radiological signs of bowel obstruction as the patient did have a bowel movement and was passing flatus. Please see Dr. Hilbert OdorSainani's dictated history and physical for further details on admission. The patient was feeling much better. Her urine culture remained negative and was close to her baseline. Was discharged home in stable condition.   VITAL SIGNS: On the date of discharge, her vital signs were as follows: Temperature 98.6, heart rate 68 per minute, respirations 17 per minute, blood pressure 136/55 mmHg. She was saturating 97% on room air.   PERTINENT PHYSICAL EXAMINATION ON THE DATE OF DISCHARGE:  CARDIOVASCULAR: S1, S2 normal. No murmurs, rubs or gallop.  LUNGS: Clear to auscultation bilaterally.  No wheezing, rales, rhonchi or crepitation.  ABDOMEN: Soft, benign.  NEUROLOGIC: Nonfocal examination.   All other physical examination remained at baseline.   DISCHARGE MEDICATIONS:  1. Lisinopril 10 mg p.o. daily.  2. Namenda XR 28 mg p.o. daily.  3. Donepezil 10 mg p.o. at bedtime.  4. Aleve 220 mg p.o. every 8 hours as needed.  5. Vitamin E 1 tablet p.o. daily.  6. Levaquin 250 mg p.o. daily for 4 more days.   DISCHARGE DIET: Low sodium.   DISCHARGE ACTIVITY: As tolerated.   DISCHARGE INSTRUCTIONS AND FOLLOWUP: The patient was instructed to follow up with a [facility] who is replacing Dr. Alonna BucklerAndrew Lamb.   TOTAL TIME DISCHARGING THIS PATIENT: 45 minutes.   ____________________________ Ellamae SiaVipul S. Sherryll BurgerShah, MD vss:gb D: 10/19/2012 17:28:32 ET T: 10/19/2012 23:40:05 ET JOB#: [id number]  cc: Chantrell Apsey S. Sherryll BurgerShah, MD, <Dictator> Primary [facility] Phys[facility] [place] Claude MangesWilliam F. Marterre, MD Ellamae SiaVIPUL S [facility] MD ELECTRONICALLY SIGNED 10/20/2012 20:43

## 2014-06-28 NOTE — H&P (Signed)
PATIENT NAME:  Abigail Dougherty, Abigail Dougherty MR#:  [id number] DATE OF BIRTH:  18-Apr-1927  DATE OF ADMISSION:  10/18/2012  PRIMARY CARE PHYSICIAN: Dr. Alonna Buckler   CHIEF COMPLAINT: Abdominal pain.   HISTORY OF PRESENT ILLNESS: This is an 80 year old female who presents to the hospital due to abdominal pain that began late yesterday evening. The pain was more in the lower part of her abdomen and crampy in nature. It progressively got worse through the night. Therefore, she came to the ER for further evaluation. She had some nausea, but no vomiting. She has also had poor p.o. intake for the past two days as per the son. She denied any fevers or chills, any diarrhea or any other associated symptoms presently. She underwent a CT scan of the abdomen and pelvis, which was suggestive of possible small bowel obstruction. The patient is already been seen by surgery and as per them, the patient likely does not have small bowel obstruction, but likely an ileus. Hospitalist services were contacted for further treatment and evaluation.   REVIEW OF SYSTEMS:  CONSTITUTIONAL: No documented fever. No weight gain, no weight loss.  EYES: No blurred or double vision.  ENT: No tinnitus. No postnasal drip. No redness of the oropharynx.   RESPIRATORY: No cough, no wheeze, hemoptysis, no dyspnea.  CARDIOVASCULAR: No chest pain, no orthopnea, no palpitations, no syncope.  GASTROINTESTINAL: Positive nausea. No vomiting. No diarrhea. Positive abdominal pain. No melena or hematochezia.  GENITOURINARY: No dysuria or hematuria.  ENDOCRINE: No polyuria or nocturia. No heat or cold intolerance.  HEMATOLOGIC: No anemia, no bruising, no bleeding.  INTEGUMENTARY: No rashes. No lesions.  MUSCULOSKELETAL: No arthritis, no swelling, no gout.  NEUROLOGIC: No numbness or tingling. No ataxia. No seizure-type activity.  PSYCHIATRIC: No anxiety, no insomnia. No ADD.   PAST MEDICAL HISTORY: Consistent with hypertension and dementia.   ALLERGIES:  MEPERIDINE, STREPTOMYCIN , SULFA AND TOPROL.   SOCIAL HISTORY: No smoking. No alcohol abuse. No illicit drug abuse. Lives at home with her son.   FAMILY HISTORY: Mother died from complications of a stroke. She also had abdominal aortic aneurysm. Father died from complications of Alzheimer's dementia.   CURRENT MEDICATIONS: As follows: Lisinopril 10 mg daily, vitamin E 1 tab daily, Namenda XR 28 mg extended-release daily, Aricept 10 mg at bedtime and Aleve 220 mg q. 8 hours as needed.   PHYSICAL EXAMINATION: Presently follows:  VITAL SIGNS:  Are noted to be: Temperature is 98.1, pulse 71, respirations 24, blood pressure 114/51, sats 97% on room air.  GENERAL: The patient is a pleasant-appearing female in no apparent distress.  HEAD, EYES, EARS, NOSE, THROAT EXAM: The patient is atraumatic, normocephalic. Extraocular muscles are intact. Pupils equal, round and reactive to light. Sclerae anicteric. No conjunctival injection. No pharyngeal erythema.  NECK: Supple. There is no jugular venous distention, no bruits, no lymphadenopathy or thyromegaly.  HEART: Regular rate and rhythm. No murmurs. No rubs. No clicks.  LUNGS: Clear to auscultation bilaterally. No rales or rhonchi. No wheezes.  ABDOMEN: Soft, flat, nontender, nondistended. Hypoactive bowel sounds. No hepatosplenomegaly appreciated.  EXTREMITIES: No evidence of any cyanosis, clubbing or peripheral edema. Has +2 pedal and radial pulses bilaterally.  NEUROLOGICAL: The patient is alert, awake, and oriented x 3 with no focal motor or sensory deficits appreciated bilaterally.  SKIN: Moist and warm with no rashes appreciated.  LYMPHATIC: There is no cervical or axillary lymphadenopathy.   LABORATORY, DIAGNOSTIC AND RADIOLOGIC DATA: Serum glucose of 93, BUN 9, creatinine 0.7, sodium  140, potassium 3.5, chloride 103, bicarbonate 33. LFTs within normal limits. White cell count 7.7, hemoglobin 13.8, hematocrit 39.9, platelet count 194. Urinalysis   shows 3+ leukocyte esterase shows 30 white cells and 1+ bacteria.   The patient did have a CT of the abdomen and pelvis done with contrast which showed a high-grade small bowel obstruction with transition point appearing to be projected in the right paracolic gutter region, severe diverticulosis.   ASSESSMENT AND PLAN: This is an 81 year old female with a history of dementia, hypertension, who presents to the hospital due to abdominal pain, nausea and CT scan findings suggestive of small bowel obstruction.  1.  Ileus/small bowel obstruction. The patient presented to the hospital with abdominal pain, nausea, likely secondary to possible ileus. Even though her CT scan is suggestive of small bowel obstruction, the patient has been evaluated by surgery, by Dr. Anda KraftMarterre,  who does not think that the patient clinically has small bowel obstruction, but is more of an over read on the CT scan. The patient is passing flattest, abdomen is not distended and she clinically feels much better now. For now, we will observe her overnight and repeat a KUB in the morning. Place her on a clear liquid diet and advance as tolerated in the morning. Continue supportive care with IV fluids and antiemetics, for now. If needed, we will reconsult surgery.  2.  Urinary tract infection. This is an incidental finding. The patient is clinically asymptomatic. She is afebrile. She has a normal white cell count. I will empirically treat her with IV ceftriaxone and follow urine cultures.  3.  Dementia.  Continue Aricept, continue Namenda.  4.  Hypertension. Continue lisinopril.  Plan was discussed with the patient's son and he is in agreement.   TIME SPENT: 45 minutes    ____________________________ Rolly PancakeVivek J. Cherlynn KaiserSainani, MD vjs:cc D: 10/18/2012 14:41:15 ET T: 10/18/2012 15:19:43 ET JOB#: [id number]  cc: Rolly PancakeVivek J. Cherlynn KaiserSainani, MD, <Dictator> Houston SirenVIVEK J SAINANI MD ELECTRONICALLY SIGNED 10/30/2012 13:33

## 2014-06-28 NOTE — Consult Note (Signed)
Brief Consult Note: Diagnosis: abnormal CT scan.   Patient was seen by consultant.   Discussed with Attending MD.   Comments: Pt's biggest complaint is dizziness. She had nausea last night and vomitied a little bit of phlegm only. She is not nauseous now, and is passing flatus, and had a normal BM yesterday. I have reviewed the CT and disagree with the report - both on the basis of the CT findings, and clinically. The small bowel is only mildly dilated (3 cm max), and does have abnormal gas in the center, but there is also stool and gas in the colon and gas in the rectum. On exam, the abdomen is mildly distended, completely non-tender, and not tympanitic. This is likely an ileus due to her ASx UTI. Will sign off.  Electronic Signatures: Claude MangesMarterre, Braelynne Garinger F (MD)  (Signed 13-Aug-14 11:07)  Authored: Brief Consult Note   Last Updated: 13-Aug-14 11:07 by Claude MangesMarterre, Callia Swim F (MD)

## 2014-06-29 NOTE — Discharge Summary (Signed)
PATIENT NAME:  Abigail KellerKUENN, Porschea B MR#:  [id number] DATE OF BIRTH:  01-13-1928  DATE OF ADMISSION:  09/12/2013 DATE OF DISCHARGE:  09/13/2013   PRIMARY CARE PHYSICIAN: Nonlocal.   CONSULTATIONS:  1. Cardiology, Dr. Darrold JunkerParaschos.  2. Psychiatry, Dr. Toni Amendlapacs.   DISCHARGE DIAGNOSES:  1. Hypertension malignancy.  2. Atrial fibrillation with bradycardia. 3. Rhabdomyolysis.  4. Urinary tract infection.  5. Dehydration.  6. Bilateral lower extremity edema, acute on chronic, with pleural effusion.  7. Stage II sacral decubitus. 8. Alzheimer's dementia.  9. Chronic diastolic congestive heart failure  CONDITION: Stable.   CODE STATUS: Full code.   HOME MEDICATIONS: Please refer to the medication reconciliation list.   DIET: Low-sodium, low-fat, low-cholesterol diet.   ACTIVITY: As tolerated.   FOLLOWUP CARE: Follow up with PCP within 1 to 2 weeks. Follow up with Dr. Darrold JunkerParaschos within 1 to 2 weeks.   REASON FOR ADMISSION: Hearing voices and sat on the living room floor for 3 days.   HOSPITAL COURSE: The patient is an 81 year old Caucasian female with a history of hypertension, Alzheimer's dementia, who was brought to ED due to auditory hallucinations and sitting on the floor for 3 days. In the ED, the patient's EKG showed atrial fibrillation with bradycardia. Chest x-ray showed pleural effusions. The patient also had lower extremity edema. For a detailed history and physical examination, please refer to the admission note dictated by Dr. Amado CoeGouru.   LABORATORY DATA: On admission date, the following: CAT scan of head did not show any abnormality. EKG showed atrial fibrillation with bradycardia at 44. Chest x-ray: Cardiomegaly  with small bilateral pleural effusion. Venous duplex was negative. BUN 26, the rest of BMP was normal. CK 244. Troponin was 0.04. CBC was normal.   1. Hypertension malignancy. After admission, the patient has been treated with lisinopril and Lasix. Hydralazine was added. Blood  pressure is better.  2. Atrial fibrillation with bradycardia, possible new-onset sick sinus syndrome, but the patient is not a candidate for anticoagulation, according to Dr. Darrold JunkerParaschos, continue oral current therapy. 3. Chronic diastolic congestive heart failure with Bilateral lower extremity edema and pleural effusion. The patient has been treated with Lasix. Echocardiograph is normal.  4. Stage II sacral decubitus. Has been treated with wound care.  5. Rhabdomyolysis, improved after IV fluid support.  6. Mild urinary tract infection. The patient was treated with Rocephin.  7. Dehydration, improved after IV fluid support.  8. The patient has severe Alzheimer's dementia and noncommunicative. According to physical therapy evaluation, the patient needs skilled nursing facility placement. Discussed the patient's discharge plan with the nurse, social worker and case Production designer, theatre/television/filmmanager.   TIME SPENT: About 39 minutes.   The patient is accepted by skilled nursing facility, and social worker is trying to contact the patient's son.   ____________________________ Shaune PollackQing Zonnie Landen, MD qc:lb D: 09/13/2013 15:05:59 ET T: 09/13/2013 15:29:41 ET JOB#: [id number]  cc: Shaune PollackQing Jeyla Bulger, MD, <Dictator> Shaune PollackQING Luana Tatro MD ELECTRONICALLY SIGNED 09/13/2013 17:06

## 2014-06-29 NOTE — Discharge Summary (Signed)
PATIENT NAME:  BELLATRIX, DEVONSHIRE MR#:  [id number] DATE OF BIRTH:  [birth year]  DATE OF ADMISSION:  11/07/2013 DATE OF DISCHARGE:  11/15/2013  There is already a discharge summary dictated 11/12/2013.  Please refer to that. This will from September 8 through September 10.  ADMISSION DIAGNOSIS:  1.  Atrial fibrillation with  rapid ventricular response.  2.  Congestive heart failure, acute on chronic diastolic.   DISCHARGE DIAGNOSES:  1.  Acute respiratory failure due to aspiration pneumonia and acute on chronic diastolic heart failure.  2.  Aspiration pneumonia.  3.  Acute diastolic heart failure.  4.  Sepsis due to aspiration pneumonia.  5.  Atrial fibrillation with rapid ventricular response.  6.  Dementia.  7.  Anemia of chronic disease from atrial fibrillation and dementia.   CONSULTATIONS:  1.  Palliative care, Laurette Schimke, NP.  2.  Pulmonary, Dr. Belia Heman.  3.  Cardiology, Dr. Juliann Pares.   LABORATORY DATA  AT DISCHARGE: Sodium 140, potassium 3.3, chloride 96, bicarbonate 39, BUN 17, creatinine 0.89, glucose 79.  Digoxin level is elevated at 2.7.   Sputum culture: Normal flora.  Urine culture:  Escherichia coli 25,000 units.   A 2-D echocardiogram showed EF of 50-55% with mild mitral valve regurgitation, moderate pleural effusion in the left lateral region. Mild aortic regurgitation, mild to moderate tricuspid regurgitation.   HOSPITAL COURSE: This is an 81 year old female presented on September 02 with shortness of breath. For further details, please refer to the H and P.  1.  Acute respiratory failure. The patient was admitted initially for acute on chronic diastolic heart failure and atrial fibrillation and RVR. During her hospitalization she subsequently had aspiration pneumonia. She was emergently intubated due to her acute aspiration and subsequently extubated on September 04. Due to her aspiration pneumonia she was then placed on BiPAP machine then transitioned to high flow and  now is on 3 liters of oxygen. She is on aspiration precautions. She still has some mild crackles on exam and a pleural effusion. Her oxygen has been weaned from high-flow nasal cannula to 2-3 liters nasal cannula.  2.  Aspiration pneumonia. The patient was seen by speech pathology. She was placed on broad-spectrum antibiotics including Zosyn. She will be discharged with Augmentin and aspiration precautions.  3.  Acute on chronic diastolic heart failure. The patient was on IV Lasix. She has diuresed quite well. Chest x-ray shows some resolving pulmonary edema with ongoing effusion. She is followed closely as an outpatient by cardiology.  4.  Sepsis due to aspiration pneumonia, not present on admission, which has resolved.  5.  Atrial fibrillation with RVR. The patient is on digoxin daily, which was started during this hospitalization. Her digoxin level is elevated at discharge and this will need to be followed daily. Once her digoxin level is less than 2 she may be restarted started on 0.125 mg daily. She will need close followup with cardiology as an outpatient.  6.  She had hypotension due to Cardizem and this was discontinued. She is on low-dose Norvasc; her heart rate has been controlled. She is allergic to Toprol.  7.  Dementia. She is back on her home medications.  8.  Anemia of chronic disease from atrial fibrillation and dementia, which is stable.   DISCHARGE MEDICATIONS:  1.  Vitamin C 500 mg daily.  2.  Iron polysaccharide daily.  3.  Namenda 28 mg daily.  4.  Donepezil 10 mg at bedtime.  5.  Fish oil 1000 mg  daily.  6.  Vitamin E 400 International Units daily. 7.  Nystatin b.i.d. to affected area.  8.  Lasix 40 mg p.o. b.i.d.  9.  K-Chlor 10 mEq at bedtime.  10. Norvasc 5 mg daily.  11. Augmentin 1 tablet p.o. q. 12 hours x 6 days.  12. Digoxin 0.125 mg daily, start once digoxin level is less than 2. 13. Aspirin 325 mg daily.   Discharge with oxygen 3 liters nasal cannula, titrate  to O2 greater than 92%.   DISCHARGE DIET: Low-sodium, low-fat, low-cholesterol; pureed, honey thick liquids, strict aspiration precautions.   DISCHARGE INSTRUCTIONS:  Please check digoxin level and BMP 11/16/2013. If digoxin level less than 2 can restart digoxin 0.125 mg daily. Check blood pressure daily.   TIME SPENT: Approximately 40 minutes.    ____________________________ Janyth ContesSital P. Juliene PinaMody, MD spm:lt D: 11/15/2013 09:09:18 ET T: 11/15/2013 09:29:26 ET JOB#: [id number]  cc: Elisabet Gutzmer P. Juliene PinaMody, MD, <Dictator>             Dwayne D. Juliann Paresallwood, MD            Marcina MillardAlexander Paraschos, MD Janyth ContesSITAL P Sitlaly Gudiel MD ELECTRONICALLY SIGNED 11/15/2013 13:27

## 2014-06-29 NOTE — Consult Note (Signed)
PATIENT NAME:  Abigail Dougherty, Abigail Dougherty MR#:  [id number] DATE OF BIRTH:  [birth year]  DATE OF CONSULTATION:  09/12/2013  REFERRING PHYSICIAN:   CONSULTING PHYSICIAN:  Audery Amel, MD  IDENTIFYING INFORMATION AND REASON FOR CONSULT: An 81 year old with a history of dementia brought into the hospital with altered mental status. Consult for concerns about unresponsiveness and decreased mental status.   HISTORY OF PRESENT ILLNESS: Chart reviewed and I attempted to interview the patient. The patient was not arousable despite my speaking her name loudly several times and gently shaking her shoulder. Full interview, therefore, obviously impossible. The patient was brought into the Emergency Room last night with a history from her family that she had been showing altered mental status, sitting on the floor almost unresponsive for days at a time.  Not eating, not taking care of herself. The patient was found to have extreme hypertension and a lot of edema. She was admitted to the hospital for stabilization. The patient is unable to give me any further history right now. Unable to answer any questions about review of systems.   The history of present illness by the admitting, says that her son said that a few days ago she reported having hallucinations. She was able to give some history to the admitting doctor and denied any psychotic symptoms at that time.   PAST PSYCHIATRIC HISTORY: This patient has been identified as having dementia for years. She has been on Aricept and Namenda for, it looks like, years. No other history of any past psychiatric illness known.   SOCIAL HISTORY: The patient lives with her family, or at least her son. I do not have any other further history right now.   SUBSTANCE ABUSE HISTORY: None known.   PAST MEDICAL HISTORY: The patient has a history of hypertension, status post mastectomy and hysterectomy. She has had episodes of admission to the hospital for similar situations as recently  as last year.   REVIEW OF SYSTEMS: Unable to obtain.   MENTAL STATUS EXAMINATION: Chronically and acutely ill looking elderly woman in a hospital room. She was slumped over in what looked like an uncomfortable position in her bed. I attempted to help her to sit up straighter and get her head off the bed railing, but she was not cooperative with that. The patient did not open her eyes. When I spoke her name or ask her direct question, she would make mumbling sounds that could be an affirmative, but she did not respond to directing inquiries to open her eyes or to say anything. Affect did not appear to be disturbed. Otherwise, not really assessable.   LABORATORY RESULTS: The patient has urinalysis that shows 3+ leukocyte esterase, positive for white blood cells greater than red blood cells. On admission, glucose was slightly elevated at 114, creatinine was okay. CK elevated. CK-MB elevated. Troponin negative. Cholesterol is okay. Thyroid stimulating hormone normal.   ASSESSMENT: This is an 81 year old woman with no past psychiatric history except for a history of dementia, presumed to be Alzheimer's disease. She has evidence on a head CT of diffuse atrophy, as well as small vessel disease. She currently is unresponsive and evidently delirious. Diagnosis would be delirium in a patient with moderate to advanced Alzheimer's disease. The hallucinations that were reported in the history would be typical of this. Almost any degree of illness can cause delirium in a patient with advanced Alzheimer's disease and vascular dementia. The urinary tract infection could possibly be a contributing factor.   TREATMENT PLAN:  Given that she is not agitated and, in fact, is currently on arousable, there is no indication to add any psychiatric medicine or to do any psychiatric intervention. I will try to follow up with her tomorrow. If she were to become agitated, low doses of intravenous haloperidol would be probably the best  first-line medication. Continue her current Namenda and Aricept. Consider treatment of urinary tract infection.   DIAGNOSIS, PRINCIPAL AND PRIMARY:  AXIS I: Delirium, multifactorial.   SECONDARY DIAGNOSES:  AXIS I: Dementia, combined Alzheimer's and vascular.  AXIS II: No diagnosis.  AXIS III: Urinary tract infection, edema, severe hypertension.  AXIS IV: Presumed moderate from chronic illness and lack of resources.  AXIS V: Functioning at time of evaluation 15.    ____________________________ Audery AmelJohn T. Clapacs, MD jtc:ts D: 09/12/2013 14:04:30 ET T: 09/12/2013 14:43:26 ET JOB#: [id number]  cc: Audery AmelJohn T. Clapacs, MD, <Dictator> Audery AmelJOHN T CLAPACS MD ELECTRONICALLY SIGNED 09/13/2013 12:03

## 2014-06-29 NOTE — Consult Note (Signed)
Chief Complaint:  Subjective/Chief Complaint Pt is vent mask with congestion and cough. Pt s/p aspiration recently. Still with AFIB bot now had hypotension.   VITAL SIGNS/ANCILLARY NOTES: **Vital Signs.:   05-Sep-15 10:00  Vital Signs Type Routine  Temperature Temperature (F) 96.7  Celsius 35.9  Temperature Source axillary  Pulse Pulse 79  Respirations Respirations 21  Systolic BP Systolic BP 95  Diastolic BP (mmHg) Diastolic BP (mmHg) 48  Mean BP 63  Pulse Ox % Pulse Ox % 93  Pulse Ox Activity Level  At rest  Oxygen Delivery Venturi Mask; 55%  *Intake and Output.:   05-Sep-15 08:30  Grand Totals Intake:   Output:  800    Net:  -69 24 Hr.:  -800  Urine ml     Out:  800  Urinary Method  Foley   Brief Assessment:  GEN well developed, well nourished, thin, critically ill appearing   Cardiac Irregular  murmur present  -- JVD  --Rub  --Gallop   Respiratory normal resp effort  rhonchi   Gastrointestinal Normal   Gastrointestinal details normal Soft   EXTR negative cyanosis/clubbing, negative edema   Lab Results: LabObservation:  04-Sep-15 15:14   OBSERVATION Reason for Test  Routine Micro:  04-Sep-15 10:21   Micro Text Report SPUTUM CULTURE   COMMENT                   NO GROWTH IN 18-24 HOURS   GRAM STAIN                EXCELLENT SPECIMEN-90-100% WBC   GRAM STAIN                MANY WHITE BLOOD CELLS   GRAM STAIN                RARE GRAM POSITIVE COCCI IN PAIRS   GRAM STAIN                RARE GRAM POSITIVE ROD   ANTIBIOTIC                       Specimen Source ETS  Culture Comment NO GROWTH IN 18-24 HOURS  Gram Stain 1 EXCELLENT SPECIMEN-90-100% WBC  Gram Stain 2 MANY WHITE BLOOD CELLS  Gram Stain 3 RARE GRAM POSITIVE COCCI IN PAIRS  Gram Stain 4 RARE GRAM POSITIVE ROD  Result(s) reported on 10 Nov 2013 at 02:36PM.  Lab:  04-Sep-15 10:55   pH (ABG)  7.53 (7.350-7.450 NOTE: New Reference Range 09/29/13)  PCO2  29 (32-48 NOTE: New Reference  Range 10/16/13)  PO2  79 (83-108 NOTE: New Reference Range 09/29/13)  Base Excess 2.4 (-3-3 NOTE: New Reference Range 10/16/13)  HCO3 24.2 (21.0-28.0 NOTE: New Reference Range 09/29/13)  O2 Saturation 98.2  O2 Device vent  Specimen Site (ABG) LT RADIAL  Specimen Type (ABG) ARTERIAL  Patient Temp (ABG) 37.0  PEEP 5.0 (Result(s) reported on 09 Nov 2013 at 11:02AM.)  FiO2 30  Mode SPONTANEOUS  PSV 8    23:30   pH (ABG)  7.48 (7.350-7.450 NOTE: New Reference Range 09/29/13)  PCO2 37 (32-48 NOTE: New Reference Range 10/16/13)  PO2  61 (83-108 NOTE: New Reference Range 09/29/13)  Base Excess  4.0 (-3-3 NOTE: New Reference Range 10/16/13)  HCO3 27.6 (21.0-28.0 NOTE: New Reference Range 09/29/13)  O2 Saturation 94.6  O2 Device ventimask  Specimen Site (ABG) RT RADIAL  Specimen Type (ABG) ARTERIAL  Patient Temp (ABG) 37.0 (Result(s)  reported on 09 Nov 2013 at 11:44PM.)  FiO2 55  Routine Chem:  04-Sep-15 04:45   Glucose, Serum  170  BUN  20  Creatinine (comp) 0.94  Sodium, Serum 138  Potassium, Serum 4.0  Chloride, Serum 105  CO2, Serum 25  Calcium (Total), Serum  8.4  Anion Gap 8  Osmolality (calc) 282  eGFR (African American) >60  eGFR (Non-African American)  56 (eGFR values <4m/min/1.73 m2 may be an indication of chronic kidney disease (CKD). Calculated eGFR is useful in patients with stable renal function. The eGFR calculation will not be reliable in acutely ill patients when serum creatinine is changing rapidly. It is not useful in  patients on dialysis. The eGFR calculation may not be applicable to patients at the low and high extremes of body sizes, pregnant women, and vegetarians.)  Result Comment LABS - This specimen was collected through an   - indwelling catheter or arterial line.  - A minimum of 561m of blood was wasted prior    - to collecting the sample.  Interpret  - results with caution.  Result(s) reported on 09 Nov 2013 at 05:24AM.    08:45    Phosphorus, Serum 3.2 (Result(s) reported on 09 Nov 2013 at 09:24AM.)  Magnesium, Serum 1.8 (1.8-2.4 THERAPEUTIC RANGE: 4-7 mg/dL TOXIC: > 10 mg/dL  -----------------------)  Result Comment LABS - This specimen was collected through an   - indwelling catheter or arterial line.  - A minimum of 18m31mof blood was wasted prior    - to collecting the sample.  Interpret  - results with caution.  Result(s) reported on 09 Nov 2013 at 09:46AM.  Result Comment LABS - This specimen was collected through an   - indwelling catheter or arterial line.  - A minimum of 18ml10mf blood was wasted prior    - to collecting the sample.  Interpret  - results with caution.  Result(s) reported on 09 Nov 2013 at 09:23AM.  Triglycerides, Serum 64  Routine Hem:  04-Sep-15 04:45   WBC (CBC)  11.6  RBC (CBC) 4.74  Hemoglobin (CBC) 12.1  Hematocrit (CBC) 38.8  Platelet Count (CBC)  146  MCV 82  MCH  25.6  MCHC  31.2  RDW  15.8  Neutrophil % 94.7  Lymphocyte % 2.0  Monocyte % 3.1  Eosinophil % 0.0  Basophil % 0.2  Neutrophil #  10.9  Lymphocyte #  0.2  Monocyte # 0.4  Eosinophil # 0.0  Basophil # 0.0   Radiology Results: XRay:    02-Sep-15 18:22, Chest Portable Single View  Chest Portable Single View   REASON FOR EXAM:    dyspnea  COMMENTS:       PROCEDURE: DXR - DXR PORTABLE CHEST SINGLE VIEW  - Nov 07 2013  6:22PM     CLINICAL DATA:  Shortness of breath and dyspnea.    EXAM:  PORTABLE CHEST - 1 VIEW    COMPARISON:  09/11/2013.    FINDINGS:  1819 hrs. Lung volumes are low. There is patchy bilateral asymmetric  airspace disease. Bilateral pleural effusions are evident, right  greater than left. The cardio pericardial silhouette is enlarged.  Imaged bony structures of the thorax are intact. Telemetry leads  overlie the chest.     IMPRESSION:  Patchy asymmetric bilateral airspace opacity with bibasilar  collapse/ consolidation and bilateral pleural effusions, right  greater than  left. Imaging features may be related to diffuse  infection although asymmetric pulmonary edema could have this  appearance.      Electronically Signed    By: Misty Stanley M.D.    On: 11/07/2013 18:55     Verified By: ERIC A. MANSELL, M.D.,    03-Sep-15 16:43, Chest Portable Single View  Chest Portable Single View   REASON FOR EXAM:    hypoxic  COMMENTS:       PROCEDURE: DXR - DXR PORTABLE CHEST SINGLE VIEW  - Nov 08 2013  4:43PM     CLINICAL DATA:  Hypoxia    EXAM:  PORTABLE CHEST - 1 VIEW    COMPARISON:  11/07/1948    FINDINGS:  Cardiac shadow is prominent butstable. Endotracheal tube is now  seen 2.7 cm above the carina. Bilateral pleural effusions are noted  right greater than left. Patchy bilateral infiltrates are again  identified similar to that seen on the prior exam.     IMPRESSION:  Status post intubation in satisfactory position. The remainder of  the exam is stable.      Electronically Signed    By: Inez Catalina M.D.    On: 11/08/2013 16:47         Verified By: Everlene Farrier, M.D.,    03-Sep-15 18:50, Chest Portable Single View  Chest Portable Single View   REASON FOR EXAM:    PICC Line Placement  COMMENTS:       PROCEDURE: DXR - DXR PORTABLE CHEST SINGLE VIEW  - Nov 08 2013  6:50PM     CLINICAL DATA:  PICC line placement.    EXAM:  PORTABLE CHEST - 1 VIEW    COMPARISON:  Same day.    FINDINGS:  Endotracheal and nasogastric tubes are in grossly good position.  Interval placement of left-sided PICC line with distal tip in the  right atrium. Bilateral pleural effusions are noted with right  greater than left. Central pulmonary vascular congestion and  bilateral perihilar edema is noted. No pneumothorax is noted.     IMPRESSION:  Stable bilateral pleural effusions and pulmonary edema compared to  prior exam. Interval placement of left-sided PICC line with distal  tip in the right atrium. Is recommended to be withdrawn at least 2-3  cm.  These results will be called to the ordering clinician or  representative by the Radiologist Assistant, and communication  documented in the PACS or zVision Dashboard.      Electronically Signed    By: Sabino Dick M.D.    On: 11/08/2013 19:03         Verified By: Marveen Reeks, M.D.,    05-Sep-15 08:52, Chest Portable Single View  Chest Portable Single View   REASON FOR EXAM:    SOB, cough.  COMMENTS:       PROCEDURE: DXR - DXR PORTABLE CHEST SINGLE VIEW  - Nov 10 2013  8:52AM     CLINICAL DATA:  Shortness of breath, cough    EXAM:  PORTABLE CHEST - 1 VIEW    COMPARISON:  11/08/2013    FINDINGS:  New focal opacity in the right upper lobe, suspicious for right  upper lobe pneumonia.  Superimposed mild interstitial edema.    Moderate right and small left pleural effusions.    Cardiomegaly.    Left arm PICC terminating at the cavoatrial junction.    Interval extubation.     IMPRESSION:  New focal right upper lobe opacity, suspicious for pneumonia.    Cardiomegaly with mild interstitial edema. Moderate right and small  left pleural effusion.  Interval extubation.      Electronically Signed    By: Julian Hy M.D.    On: 11/10/2013 08:59         Verified By: Julian Hy, M.D.,  Cardiology:    02-Sep-15 18:05, ECG  Ventricular Rate 147  Atrial Rate 153  QRS Duration 102  QT 328  QTc 513  R Axis 0  T Axis 155  ECG interpretation   Atrial fibrillation with rapid ventricular response with premature ventricular or aberrantly conducted complexes  Septal infarct , age undetermined  Abnormal ECG  When compared with ECG of 11-Sep-2013 20:38,  Atrial fibrillation has replaced Junctional rhythm  Vent. rate has increased BY 103 BPM  Septal infarct is now Present  T wave inversion no longer evident in Inferior leads  T wave inversion no longer evident in Anterior leads  T wave inversion more evident in Lateral  leads  ----------unconfirmed----------  Confirmed by OVERREAD, NOT ([id number]), editor PEARSON, BARBARA ([id number]) on 11/08/2013 8:16:23 AM  ECG     02-Sep-15 19:56, ECG  Ventricular Rate 124  Atrial Rate 131  QRS Duration 100  QT 348  QTc 499  R Axis -25  T Axis 133  ECG interpretation   Atrial fibrillation with rapid ventricular response with premature ventricular or aberrantly conducted complexes  Septal infarct (cited on or before 07-Nov-2013)  Abnormal ECG  When compared with ECG of 07-Nov-2013 18:05,  Nonspecific T wave abnormality has replaced inverted T waves in Lateral leads  ----------unconfirmed----------  Confirmed by OVERREAD, NOT ([id number]), editor PEARSON, BARBARA ([id number]) on 11/08/2013 8:16:32 AM  ECG    Assessment/Plan:  Invasive Device Daily Assessment of Necessity:  Does the patient currently have any of the following indwelling devices? foley   Indwelling Urinary Catheter continued, requirement due to   Reason to continue Indwelling Urinary Catheter for strict Intake/Output monitoring for hemodynamic instability   Assessment/Plan:  Assessment IMP Aspiration  Resp Failure Hypotension AFIB Dementia Elevated Glucose .   Plan PLAN Agree with ICU care Continue resp support Inhalers as necessary 02 via mask Rate control for AFIB with Digoxin Wean pressors if possible Continue dementia therapy Pulmonary support by suctioning Short term anticoug   Electronic Signatures: Lujean Amel D (MD)  (Signed 06-Sep-15 07:38)  Authored: Chief Complaint, VITAL SIGNS/ANCILLARY NOTES, Brief Assessment, Lab Results, Radiology Results, Assessment/Plan   Last Updated: 06-Sep-15 07:38 by Lujean Amel D (MD)

## 2014-06-29 NOTE — Consult Note (Signed)
Chief Complaint:  Subjective/Chief Complaint status post aspiration now extubated  after bout of  respiratory failure patient states to be doing okay denies chest pain. still has some tachycardia from AFib   VITAL SIGNS/ANCILLARY NOTES: **Vital Signs.:   04-Sep-15 11:00  Vital Signs Type Routine  Celsius 37.2  Temperature Source rectal  Pulse Pulse 121  Respirations Respirations 26  Systolic BP Systolic BP 951  Diastolic BP (mmHg) Diastolic BP (mmHg) 81  Mean BP 87  Pulse Ox % Pulse Ox % 98  Pulse Ox Activity Level  At rest  Oxygen Delivery Ventilator Assisted  *Intake and Output.:   04-Sep-15 11:00  Grand Totals Intake:  18.8 Output:      Net:  18.8 24 Hr.:  -156.6  IV (Primary)      In:  18.8   Brief Assessment:  GEN well developed, well nourished, no acute distress, cachectic   Cardiac Irregular  murmur present  + LE edema  --Rub   Respiratory normal resp effort  wheezing  rhonchi   Gastrointestinal Normal   Gastrointestinal details normal Soft  Nontender  Nondistended  No masses palpable   EXTR negative cyanosis/clubbing, positive edema   Lab Results: Lab:  04-Sep-15 10:55   pH (ABG)  7.53 (7.350-7.450 NOTE: New Reference Range 09/29/13)  PCO2  29 (32-48 NOTE: New Reference Range 10/16/13)  PO2  79 (83-108 NOTE: New Reference Range 09/29/13)  FiO2 30  Base Excess 2.4 (-3-3 NOTE: New Reference Range 10/16/13)  HCO3 24.2 (21.0-28.0 NOTE: New Reference Range 09/29/13)  O2 Saturation 98.2  O2 Device vent  Specimen Site (ABG) LT RADIAL  Specimen Type (ABG) ARTERIAL  Patient Temp (ABG) 37.0  Mode SPONTANEOUS  PSV 8  PEEP 5.0 (Result(s) reported on 09 Nov 2013 at 11:02AM.)  Routine Chem:  04-Sep-15 04:45   Result Comment LABS - This specimen was collected through an   - indwelling catheter or arterial line.  - A minimum of 108ms of blood was wasted prior    - to collecting the sample.  Interpret  - results with caution.  Result(s) reported on 09 Nov 2013 at 05:24AM.  Glucose, Serum  170  BUN  20  Creatinine (comp) 0.94  Sodium, Serum 138  Potassium, Serum 4.0  Chloride, Serum 105  CO2, Serum 25  Calcium (Total), Serum  8.4  Anion Gap 8  Osmolality (calc) 282  eGFR (African American) >60  eGFR (Non-African American)  56 (eGFR values <694mmin/1.73 m2 may be an indication of chronic kidney disease (CKD). Calculated eGFR is useful in patients with stable renal function. The eGFR calculation will not be reliable in acutely ill patients when serum creatinine is changing rapidly. It is not useful in  patients on dialysis. The eGFR calculation may not be applicable to patients at the low and high extremes of body sizes, pregnant women, and vegetarians.)    08:45   Result Comment LABS - This specimen was collected through an   - indwelling catheter or arterial line.  - A minimum of 67m70mof blood was wasted prior    - to collecting the sample.  Interpret  - results with caution.  Result(s) reported on 09 Nov 2013 at 09:46AM.  Result Comment LABS - This specimen was collected through an   - indwelling catheter or arterial line.  - A minimum of 67ml6mf blood was wasted prior    - to collecting the sample.  Interpret  - results with caution.  Result(s) reported on  09 Nov 2013 at 09:23AM.  Triglycerides, Serum 64  Magnesium, Serum 1.8 (1.8-2.4 THERAPEUTIC RANGE: 4-7 mg/dL TOXIC: > 10 mg/dL  -----------------------)  Phosphorus, Serum 3.2 (Result(s) reported on 09 Nov 2013 at 09:24AM.)  Routine Hem:  04-Sep-15 04:45   WBC (CBC)  11.6  RBC (CBC) 4.74  Hemoglobin (CBC) 12.1  Hematocrit (CBC) 38.8  Platelet Count (CBC)  146  MCV 82  MCH  25.6  MCHC  31.2  RDW  15.8  Neutrophil % 94.7  Lymphocyte % 2.0  Monocyte % 3.1  Eosinophil % 0.0  Basophil % 0.2  Neutrophil #  10.9  Lymphocyte #  0.2  Monocyte # 0.4  Eosinophil # 0.0  Basophil # 0.0   Radiology Results: XRay:    02-Sep-15 18:22, Chest Portable Single View   Chest Portable Single View   REASON FOR EXAM:    dyspnea  COMMENTS:       PROCEDURE: DXR - DXR PORTABLE CHEST SINGLE VIEW  - Nov 07 2013  6:22PM     CLINICAL DATA:  Shortness of breath and dyspnea.    EXAM:  PORTABLE CHEST - 1 VIEW    COMPARISON:  09/11/2013.    FINDINGS:  1819 hrs. Lung volumes are low. There is patchy bilateral asymmetric  airspace disease. Bilateral pleural effusions are evident, right  greater than left. The cardio pericardial silhouette is enlarged.  Imaged bony structures of the thorax are intact. Telemetry leads  overlie the chest.     IMPRESSION:  Patchy asymmetric bilateral airspace opacity with bibasilar  collapse/ consolidation and bilateral pleural effusions, right  greater than left. Imaging features may be related to diffuse  infection although asymmetric pulmonary edema could have this  appearance.      Electronically Signed    By: Misty Stanley M.D.    On: 11/07/2013 18:55     Verified By: ERIC A. MANSELL, M.D.,    03-Sep-15 16:43, Chest Portable Single View  Chest Portable Single View   REASON FOR EXAM:    hypoxic  COMMENTS:       PROCEDURE: DXR - DXR PORTABLE CHEST SINGLE VIEW  - Nov 08 2013  4:43PM     CLINICAL DATA:  Hypoxia    EXAM:  PORTABLE CHEST - 1 VIEW    COMPARISON:  11/07/1948    FINDINGS:  Cardiac shadow is prominent butstable. Endotracheal tube is now  seen 2.7 cm above the carina. Bilateral pleural effusions are noted  right greater than left. Patchy bilateral infiltrates are again  identified similar to that seen on the prior exam.     IMPRESSION:  Status post intubation in satisfactory position. The remainder of  the exam is stable.      Electronically Signed    By: Inez Catalina M.D.    On: 11/08/2013 16:47         Verified By: Everlene Farrier, M.D.,    03-Sep-15 18:50, Chest Portable Single View  Chest Portable Single View   REASON FOR EXAM:    PICC Line Placement  COMMENTS:       PROCEDURE: DXR  - DXR PORTABLE CHEST SINGLE VIEW  - Nov 08 2013  6:50PM     CLINICAL DATA:  PICC line placement.    EXAM:  PORTABLE CHEST - 1 VIEW    COMPARISON:  Same day.    FINDINGS:  Endotracheal and nasogastric tubes are in grossly good position.  Interval placement of left-sided PICC line with distal  tip in the  right atrium. Bilateral pleural effusions are noted with right  greater than left. Central pulmonary vascular congestion and  bilateral perihilar edema is noted. No pneumothorax is noted.     IMPRESSION:  Stable bilateral pleural effusions and pulmonary edema compared to  prior exam. Interval placement of left-sided PICC line with distal  tip in the right atrium. Is recommended to be withdrawn at least 2-3  cm. These results will be called to the ordering clinician or  representative by the Radiologist Assistant, and communication  documented in the PACS or zVision Dashboard.      Electronically Signed    By: Sabino Dick M.D.    On: 11/08/2013 19:03         Verified By: Marveen Reeks, M.D.,  Cardiology:    02-Sep-15 18:05, ECG  Ventricular Rate 147  Atrial Rate 153  QRS Duration 102  QT 328  QTc 513  R Axis 0  T Axis 155  ECG interpretation   Atrial fibrillation with rapid ventricular response with premature ventricular or aberrantly conducted complexes  Septal infarct , age undetermined  Abnormal ECG  When compared with ECG of 11-Sep-2013 20:38,  Atrial fibrillation has replaced Junctional rhythm  Vent. rate has increased BY 103 BPM  Septal infarct is now Present  T wave inversion no longer evident in Inferior leads  T wave inversion no longer evident in Anterior leads  T wave inversion more evident in Lateral leads  ----------unconfirmed----------  Confirmed by OVERREAD, NOT ([id number]), editor PEARSON, BARBARA ([id number]) on 11/08/2013 8:16:23 AM  ECG     02-Sep-15 19:56, ECG  Ventricular Rate 124  Atrial Rate 131  QRS Duration 100  QT 348  QTc 499  R Axis -25  T  Axis 133  ECG interpretation   Atrial fibrillation with rapid ventricular response with premature ventricular or aberrantly conducted complexes  Septal infarct (cited on or before 07-Nov-2013)  Abnormal ECG  When compared with ECG of 07-Nov-2013 18:05,  Nonspecific T wave abnormality has replaced inverted T waves in Lateral leads  ----------unconfirmed----------  Confirmed by OVERREAD, NOT ([id number]), editor PEARSON, BARBARA ([id number]) on 11/08/2013 8:16:32 AM  ECG    Assessment/Plan:  Assessment/Plan:  Assessment IMP  atrial fibrillation   respiratory failure  edema  dementia GERD  aspiration pneumonia  .   Plan PLAN  control atrial fibrillation rate with digoxin  consider amiodarone therapy for rhythm control  hold off on anticoagulation at this point   recommend Lasix therapy to help with edema  echocardiogram for evaluation of overall LV function because of AFib and edema  continue   respiratory support  including inhalers  recommend swallowing evaluation   Electronic Signatures: Yolonda Kida (MD)  (Signed 04-Sep-15 14:55)  Authored: Chief Complaint, VITAL SIGNS/ANCILLARY NOTES, Brief Assessment, Lab Results, Radiology Results, Assessment/Plan   Last Updated: 04-Sep-15 14:55 by Yolonda Kida (MD)

## 2014-06-29 NOTE — Consult Note (Signed)
Brief Consult Note: Diagnosis: delirium due to medical illness in patient with chronic dementia.   Patient was seen by consultant.   Consult note dictated.   Comments: Patient seen. Chart reviewed. Patient with history of demintia is currently unable to give any history. Will re-evaluate tomorrow. No indication for any psychiatric treatment at this time. Consider treating UTI.  Electronic Signatures: Audery Amellapacs, Anyssa Sharpless T (MD)  (Signed 08-Jul-15 13:56)  Authored: Brief Consult Note   Last Updated: 08-Jul-15 13:56 by Audery Amellapacs, Timia Casselman T (MD)

## 2014-06-29 NOTE — Consult Note (Signed)
PATIENT NAME:  Abigail Dougherty, Abigail Dougherty MR#:  [id number] DATE OF BIRTH:  02-Sep-1927  DATE OF CONSULTATION:  09/12/2013  REFERRING PHYSICIAN:  Ramonita LabAruna Gouru, MD.  CONSULTING PHYSICIAN:  Marcina MillardAlexander Nayef [employer], MD  CARDIOLOGIST:  Marcina MillardAlexander Emet Rafanan, MD.  CHIEF COMPLAINT: "I am hearing voices."   HISTORY OF PRESENT ILLNESS: The patient is an 81 year old female with history of hypertension, Alzheimer dementia and atrial fibrillation. The patient apparently has been hearing voices, has been sitting on her living room floor against advice from her son. EMS was called and [facility]. EKG revealed atrial fibrillation with bradycardia. Chest x-ray did demonstrate bilateral pleural effusions. The patient has been experiencing peripheral edema. The patient was prescribed furosemide; however, the patient had not been taking her medications. The patient was also initially markedly hypertensive.   ADMISSION LABORATORY DATA:  Include initial troponin 0.04 followed by slight elevation of 0.06. CPK and MB were 209 and 4.8, respectively. The patient denies chest pain.   PAST MEDICAL HISTORY:  Hypertension, diastolic congestive heart failure, Alzheimer dementia.   MEDICATIONS: Lisinopril 10 mg daily, vitamin E 400 units daily, Namenda XR 28 mg daily, fish oil capsules 1000 mg daily, donepezil 10 mg daily, Aleve 1 tablet q.8 hours.  SOCIAL HISTORY:  The patient currently lives with her son. She needs assistance for both eating and bathing.   FAMILY HISTORY: No immediate family history for coronary artery disease or myocardial infarction.   REVIEW OF SYSTEMS: CONSTITUTIONAL: No fever or chills.  EYES: No blurry vision.  EARS: No hearing loss.  RESPIRATORY: No shortness of breath.  CARDIOVASCULAR: No chest pain.  GASTROINTESTINAL: No nausea, vomiting, or diarrhea.  GENITOURINARY: No dysuria or hematuria.  ENDOCRINE: No polyuria or polydipsia.  MUSCULOSKELETAL: No arthralgias or  myalgias.  NEUROLOGICAL: No focal muscle weakness or numbness.  PSYCHOLOGICAL: The patient has Alzheimer dementia and has been hearing voices.   PHYSICAL EXAMINATION:  VITAL SIGNS: Blood pressure 144/50, pulse 41, respirations 18, temperature 97.6, pulse oximetry 97%.  HEENT: Pupils equal, reactive to light and accommodation.  NECK: Supple without thyromegaly.  LUNGS: Clear.  HEART: Normal JVP. Normal PMI. Bradycardia. Normal S1, S2. No appreciable gallop, murmur, or rub.  ABDOMEN: Soft and nontender. Pulses were intact bilaterally.  MUSCULOSKELETAL: Normal muscle tone.  NEUROLOGIC: The patient was somnolent, but arousable; motor and sensory both grossly intact.   IMPRESSION: An [age] year old female with Alzheimer dementia, hearing voices, was found sitting on her floor for 3 days, although she did receive medications. The patient presents with atrial fibrillation with a slow ventricular rate, although it is unclear the clinical importance since the patient is hemodynamically stable.   RECOMMENDATIONS:  1.  Agree with overall current therapy.  2.  Will continue to follow on telemetry.  3.  Will discuss further with patient's son about the risks, benefits, and alternatives of permanent pacemaker implantation. At this time is difficult to assess the benefits in light of her severe dementia.    ____________________________ Marcina MillardAlexander Doralee Kocak, MD ap:lt D: 09/12/2013 08:01:08 ET T: 09/12/2013 10:26:00 ET JOB#: [id number]  cc: Marcina MillardAlexander Edythe Riches, MD, <Dictator> Marcina MillardALEXANDER Samin Milke MD ELECTRONICALLY SIGNED 10/16/2013 13:54

## 2014-06-29 NOTE — H&P (Signed)
PATIENT NAME:  Abigail Dougherty, Abigail Dougherty MR#:  [id number] DATE OF BIRTH:  12-Aug-1927  PRIMARY CARE PHYSICIAN: Unknown.  HISTORY OF PRESENT ILLNESS: The patient is an 81 year old Caucasian female with past medical history significant for history of chronic diastolic CHF, history of atrial fibrillation, who presents to the hospital with complaints of shortness of breath. Apparently the patient was doing well up until approximately 1 week ago when she started having worsening shortness of breath. She came in to the hospital with significant bilateral lower extremity edema as well as pulmonary edema on chest x-ray. Her O2 saturation was 91% on room air. She was noted to be in atrial fibrillation with rapid ventricular response with heart rate of 140 in the Emergency Room.  Hospitalist services were contacted for admission.   PAST MEDICAL HISTORY: Significant for history of Alzheimer dementia, hypertension,  history of rheumatic fever for which she is being followed by Dr. Darrold Junker, history of vein stripping in the past, mastectomy, hysterectomy, as well as left elbow repair.   ALLERGIES: MEPERIDINE, STREPTOMYCIN, SULFA DRUGS AS WELL AS TOPROL-XL.   SOCIAL HISTORY: The patient lives at home with her son, can feed herself, but otherwise needs assistance with bathing.   FAMILY HISTORY: Hypertension runs in the family.  REVIEW OF SYSTEMS: Unobtainable as patient demented.   PHYSICAL EXAMINATION:  VITAL SIGNS:  On arrival to the Emergency Room,  temperature is 96.4, pulse 142, respiration was 22, blood pressure 103/92, saturation was 91% on room air.  GENERAL: This is a well-developed, well-nourished Caucasian female in no significant distress, uncomfortable in the stretcher. She is using oxygen through nasal cannula.  HEENT: Her pupils are equal, reactive to light. Extraocular muscles intact. No icterus or conjunctivitis. Has normal hearing. No pharyngeal erythema. Mucosa is moist.  NECK: No masses. Supple,  nontender. Thyroid is not enlarged. No adenopathy. No JVD or carotid bruits bilaterally. Full range of motion.  LUNGS: Crackles bilaterally in all fields. No rhonchi or diminished breath sounds, and no wheezing. No labored inspiration, increased effort, or dullness to percussion, not in overt respiratory distress. Again, she is using nasal cannula oxygen.  CARDIOVASCULAR: S1, S2 appreciated, rythm was irregular, 3/6 systolic murmur was heard.  PMI not lateralized. Chest is nontender to palpation. Diminished pedal pulses.  Lower extremity edema 3 to 4+ was noted bilaterally.  ABDOMEN: Soft, nontender. Bowel sounds are present. No hepatosplenomegaly or masses were noted.  RECTAL: Deferred.  MUSCLE STRENGTH: Able to move all extremities; however, her gait is not tested. No cyanosis was noted.  No kyphosis.   SKIN: Did not reveal any rashes, lesions, erythema, nodularity. It was very indurated in lower extremities in calf area; however, otherwise was warm and dry to palpation LYMPHATIC: No adenopathy in the cervical region.  NEUROLOGIC: Cranial nerves grossly intact. Sensory is intact. No dysarthria or aphasia. The patient is alert, oriented to self. Not cooperative. Memory is really bad and she is confused, agitated, and refused to come to the hospital.    LABORATORY DATA: The patient's beta-type natriuretic peptide was high at 6790. BUN and creatinine were 22 and 0.92, sodium 133, potassium 3.3, magnesium 1.9, and glucose level 112. Liver enzymes: Albumin level of 2.4, alkaline phosphatase 141, otherwise liver enzymes were unremarkable. The patient's troponin is 0.02. White blood cell count of 10.7, hemoglobin 13.5, platelet count 152,000.   RADIOLOGIC STUDIES: Chest x-ray, portable, single view, 11/07/2013, revealed patchy asymmetry, bilateral airspace opacity with bibasilar collapse, consolidation, and bilateral pleural effusions, right greater than left.  Imaging features may reflect diffuse infection,  although asymmetric pulmonary edema could have this appearance according to radiologist.  ASSESSMENT AND PLAN: 1.  Atrial fibrillation, rapid ventricular response. Admit the patient to the medical floor. Start him on amiodarone intravenous drip and start also on Cardizem orally as needed.  2.  Hypertension. Will hold all patient's blood pressure medications and resume blood pressure medications as needed.  3.  Congestive heart failure, acute on chronic, diastolic. Will initiate the patient on Lasix intravenously.  4.  Hypokalemia, supplementing p.o.  5.  Dementia with agitation. The patient will be given Haldol 1 dose in the Emergency Room as well as will be initiating her on Risperdal p.o.   TIME SPENT: Fifty minutes.    ____________________________ Katharina Caperima Braeden Kennan, MD rv:LT D: 11/07/2013 20:39:52 ET T: 11/07/2013 21:03:14 ET JOB#: [id number]  cc: Katharina Caperima Pallas Wahlert, MD, <Dictator> Timmy Cleverly MD ELECTRONICALLY SIGNED 12/04/2013 15:09
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
## 2014-06-29 NOTE — Consult Note (Signed)
Chief Complaint:  Subjective/Chief Complaint BiPAP mask Pt  with congestion and cough. Pt s/p aspiration recently. Still with AFIB butt hypotension improved. AMS more lethargic   VITAL SIGNS/ANCILLARY NOTES: **Vital Signs.:   06-Sep-15 16:00  Pulse Pulse 92  Respirations Respirations 18  Systolic BP Systolic BP 528  Diastolic BP (mmHg) Diastolic BP (mmHg) 50  Mean BP 67  Pulse Ox % Pulse Ox % 94  Oxygen Delivery Non-invasive ventilation (CPAP/BIPAP)  *Intake and Output.:   06-Sep-15 15:00  Grand Totals Intake:  10 Output:      Net:  10 24 Hr.:  -600  IV (Secondary)      In:  10   Brief Assessment:  GEN well developed, well nourished, thin, critically ill appearing   Cardiac Irregular  murmur present  -- JVD  --Rub  --Gallop   Respiratory normal resp effort  rhonchi   Gastrointestinal Normal   Gastrointestinal details normal Soft   EXTR negative cyanosis/clubbing, negative edema   Lab Results: Lab:  06-Sep-15 06:20   pH (ABG)  7.51 (7.350-7.450 NOTE: New Reference Range 09/29/13)  PCO2 40 (32-48 NOTE: New Reference Range 10/16/13)  PO2  74 (83-108 NOTE: New Reference Range 09/29/13)  Base Excess  8.2 (-3-3 NOTE: New Reference Range 10/16/13)  HCO3  31.9 (21.0-28.0 NOTE: New Reference Range 09/29/13)  O2 Saturation 95.3  O2 Device BIPAP  Specimen Site (ABG) LT RADIAL  Specimen Type (ABG) ARTERIAL  Patient Temp (ABG) 37.0  PEEP 6.0  CPAP 12.0  %FiO2 60.0  Mechanical Rate 8 (Result(s) reported on 11 Nov 2013 at 06:27AM.)  Routine Chem:  06-Sep-15 05:19   Glucose, Serum  138  BUN  20  Creatinine (comp) 1.18  Sodium, Serum 140  Potassium, Serum 3.6  Chloride, Serum 102  CO2, Serum 30  Calcium (Total), Serum  8.0  Anion Gap 8  Osmolality (calc) 284  eGFR (African American)  49  eGFR (Non-African American)  42 (eGFR values <67m/min/1.73 m2 may be an indication of chronic kidney disease (CKD). Calculated eGFR is useful in patients with stable renal  function. The eGFR calculation will not be reliable in acutely ill patients when serum creatinine is changing rapidly. It is not useful in  patients on dialysis. The eGFR calculation may not be applicable to patients at the low and high extremes of body sizes, pregnant women, and vegetarians.)  Phosphorus, Serum 3.4 (Result(s) reported on 11 Nov 2013 at 05:43AM.)  Magnesium, Serum 1.9 (1.8-2.4 THERAPEUTIC RANGE: 4-7 mg/dL TOXIC: > 10 mg/dL  -----------------------)   Radiology Results: XRay:    02-Sep-15 18:22, Chest Portable Single View  Chest Portable Single View   REASON FOR EXAM:    dyspnea  COMMENTS:       PROCEDURE: DXR - DXR PORTABLE CHEST SINGLE VIEW  - Nov 07 2013  6:22PM     CLINICAL DATA:  Shortness of breath and dyspnea.    EXAM:  PORTABLE CHEST - 1 VIEW    COMPARISON:  09/11/2013.    FINDINGS:  1819 hrs. Lung volumes are low. There is patchy bilateral asymmetric  airspace disease. Bilateral pleural effusions are evident, right  greater than left. The cardio pericardial silhouette is enlarged.  Imaged bony structures of the thorax are intact. Telemetry leads  overlie the chest.     IMPRESSION:  Patchy asymmetric bilateral airspace opacity with bibasilar  collapse/ consolidation and bilateral pleural effusions, right  greater than left. Imaging features may be related to diffuse  infection although asymmetric  pulmonary edema could have this  appearance.      Electronically Signed    By: Misty Stanley M.D.    On: 11/07/2013 18:55     Verified By: ERIC A. MANSELL, M.D.,    03-Sep-15 16:43, Chest Portable Single View  Chest Portable Single View   REASON FOR EXAM:    hypoxic  COMMENTS:       PROCEDURE: DXR - DXR PORTABLE CHEST SINGLE VIEW  - Nov 08 2013  4:43PM     CLINICAL DATA:  Hypoxia    EXAM:  PORTABLE CHEST - 1 VIEW    COMPARISON:  11/07/1948    FINDINGS:  Cardiac shadow is prominent butstable. Endotracheal tube is now  seen 2.7 cm above  the carina. Bilateral pleural effusions are noted  right greater than left. Patchy bilateral infiltrates are again  identified similar to that seen on the prior exam.     IMPRESSION:  Status post intubation in satisfactory position. The remainder of  the exam is stable.      Electronically Signed    By: Inez Catalina M.D.    On: 11/08/2013 16:47         Verified By: Everlene Farrier, M.D.,    03-Sep-15 18:50, Chest Portable Single View  Chest Portable Single View   REASON FOR EXAM:    PICC Line Placement  COMMENTS:       PROCEDURE: DXR - DXR PORTABLE CHEST SINGLE VIEW  - Nov 08 2013  6:50PM     CLINICAL DATA:  PICC line placement.    EXAM:  PORTABLE CHEST - 1 VIEW    COMPARISON:  Same day.    FINDINGS:  Endotracheal and nasogastric tubes are in grossly good position.  Interval placement of left-sided PICC line with distal tip in the  right atrium. Bilateral pleural effusions are noted with right  greater than left. Central pulmonary vascular congestion and  bilateral perihilar edema is noted. No pneumothorax is noted.     IMPRESSION:  Stable bilateral pleural effusions and pulmonary edema compared to  prior exam. Interval placement of left-sided PICC line with distal  tip in the right atrium. Is recommended to be withdrawn at least 2-3  cm. These results will be called to the ordering clinician or  representative by the Radiologist Assistant, and communication  documented in the PACS or zVision Dashboard.      Electronically Signed    By: Sabino Dick M.D.    On: 11/08/2013 19:03         Verified By: Marveen Reeks, M.D.,    05-Sep-15 08:52, Chest Portable Single View  Chest Portable Single View   REASON FOR EXAM:    SOB, cough.  COMMENTS:       PROCEDURE: DXR - DXR PORTABLE CHEST SINGLE VIEW  - Nov 10 2013  8:52AM     CLINICAL DATA:  Shortness of breath, cough    EXAM:  PORTABLE CHEST - 1 VIEW    COMPARISON:  11/08/2013    FINDINGS:  New focal opacity  in the right upper lobe, suspicious for right  upper lobe pneumonia.  Superimposed mild interstitial edema.    Moderate right and small left pleural effusions.    Cardiomegaly.    Left arm PICC terminating at the cavoatrial junction.    Interval extubation.     IMPRESSION:  New focal right upper lobe opacity, suspicious for pneumonia.    Cardiomegaly with mild interstitial edema. Moderate  right and small  left pleural effusion.  Interval extubation.      Electronically Signed    By: Julian Hy M.D.    On: 11/10/2013 08:59         Verified By: Julian Hy, M.D.,  Cardiology:    02-Sep-15 18:05, ECG  Ventricular Rate 147  Atrial Rate 153  QRS Duration 102  QT 328  QTc 513  R Axis 0  T Axis 155  ECG interpretation   Atrial fibrillation with rapid ventricular response with premature ventricular or aberrantly conducted complexes  Septal infarct , age undetermined  Abnormal ECG  When compared with ECG of 11-Sep-2013 20:38,  Atrial fibrillation has replaced Junctional rhythm  Vent. rate has increased BY 103 BPM  Septal infarct is now Present  T wave inversion no longer evident in Inferior leads  T wave inversion no longer evident in Anterior leads  T wave inversion more evident in Lateral leads  ----------unconfirmed----------  Confirmed by OVERREAD, NOT ([id number]), editor PEARSON, BARBARA ([id number]) on 11/08/2013 8:16:23 AM  ECG     02-Sep-15 19:56, ECG  Ventricular Rate 124  Atrial Rate 131  QRS Duration 100  QT 348  QTc 499  R Axis -25  T Axis 133  ECG interpretation   Atrial fibrillation with rapid ventricular response with premature ventricular or aberrantly conducted complexes  Septal infarct (cited on or before 07-Nov-2013)  Abnormal ECG  When compared with ECG of 07-Nov-2013 18:05,  Nonspecific T wave abnormality has replaced inverted T waves in Lateral leads  ----------unconfirmed----------  Confirmed by OVERREAD, NOT ([id number]), editor PEARSON, BARBARA  ([id number]) on 11/08/2013 8:16:32 AM  ECG     04-Sep-15 14:49, Echo Doppler  Echo Doppler   REASON FOR EXAM:      COMMENTS:       PROCEDURE: [place] - ECHO DOPPLER COMPLETE(TRANSTHOR)  - Nov 09 2013  2:49PM     RESULT: Echocardiogram Report    Patient Name:   Abigail Dougherty Date of Exam: 11/09/2013  Medical Rec #:  [id number]          Custom1:  Dateof Birth:  1927-05-21        Height:       70.0 in  Patient Age:    81 years        Weight:       176.0 lb  Patient Gender: F               BSA:          1.98 m??    Indications: Atrial Fib  Sonographer:    Sherrie Sport RDCS  Referring Phys: Ether Griffins, [place]    Summary:   1. Left ventricular ejection fraction, by visual estimation, is 50 to   55%.   2. Low normal global left ventricular systolic function.   3. Mildly dilated left atrium.   4. Moderate pleural effusion in the left lateral region.   5. Mild mitral valve regurgitation.   6. Mild aortic regurgitation.   7. Mild to moderate tricuspid regurgitation.  2D AND M-MODE MEASUREMENTS (normal ranges within parentheses):  Left Ventricle:          Normal  IVSd (2D):      1.33 cm (0.7-1.1)  LVPWd (2D):     1.04 cm (0.7-1.1) Aorta/LA:                  Normal  LVIDd (2D):     3.69 cm (3.4-5.7) Aortic Root (  2D): 3.60 cm (2.4-3.7)  LVIDs (2D):     2.54 cm           Left Atrium (2D): 4.40 cm (1.9-4.0)  LV FS (2D):     31.2 %   (>25%)  LV EF (2D):59.8 %   (>50%)                                    Right Ventricle:                                    RVd (2D):        9.77 cm  LV DIASTOLIC FUNCTION:  MV Peak E: 1.30 m/s E/e' Ratio: 21.80                      Decel Time: 194 msec  SPECTRAL DOPPLERANALYSIS (where applicable):  Mitral Valve:  MV P1/2 Time: 56.26 msec  MV Area, PHT: 3.91 cm??  Aortic Valve: AoV Max Vel: 1.85 m/s AoV Peak PG: 13.7 mmHg AoV Mean PG:   7.5 mmHg  LVOT Vmax: 1.38 m/s LVOT VTI: 0.232 m LVOT Diameter: 2.00 cm  AoV Area,Vmax: 2.34 cm?? AoV Area, VTI: 2.62 cm?? AoV Area, Vmn: 2.31  cm??  Tricuspid Valve and PA/RV Systolic Pressure: TR Max Velocity: 2.88 m/s RA   Pressure: 5 mmHg RVSP/PASP: 38.3 mmHg  Pulmonic Valve:  PV Max Velocity: 0.92 m/s PV Max PG: 3.4 mmHg PV Mean PG:    PHYSICIAN INTERPRETATION:  Left Ventricle: The left ventricular internal cavity size was normal. LV   septal wall thickness was normal. LV posterior wall thickness was normal.   Global LV systolic function was low normal. Left ventricular ejection   fraction, by visual estimation, is 50 to 55%.  Right Ventricle: The right ventricular size is normal. Global RV systolic   function is normal.  Left Atrium: The left atrium is mildly dilated.  Right Atrium: The right atrium is normal in size.  Pericardium: There is no evidence of pericardial effusion. There is a   moderate pleural effusion in the left lateral region.  Mitral Valve: The mitral valve is normal in structure. Mild mitral valve   regurgitation is seen.  Tricuspid Valve: The tricuspid valve is normal. Mild to moderate   tricuspid regurgitation is visualized. The tricuspid regurgitant velocity   is 2.88 m/s, and with an assumed right atrial pressure of 5 mmHg, the   estimated right ventricular systolic pressure is normal at 38.3 mmHg.  Aortic Valve: The aortic valve is normal. Mild aortic valve regurgitation   is seen.  Pulmonic Valve: The pulmonic valve is normal.    [place] MD  Electronically signed by [place] MD  Signature Date/Time: 11/11/2013/7:29:04 PM    *** Final ***    IMPRESSION: .        Verified By: Yolonda Kida, M.D., MD   Assessment/Plan:  Invasive Device Daily Assessment of Necessity:  Does the patient currently have any of the following indwelling devices? foley   Indwelling Urinary Catheter continued, requirement due to   Reason to continue Indwelling Urinary Catheter for strict Intake/Output monitoring for hemodynamic instability   Assessment/Plan:  Assessment  IMP AMS SOB Aspiration  Resp Failure Hypotension AFIB Dementia Elevated Glucose Weakness .   Plan PLAN Agree with palliative care Agree  with ICU care Continue resp support Inhalers as necessary 02 via mask Rate control for AFIB with Digoxin Wean pressors if possible Continue dementia therapy Pulmonary support by suctioning Short term anticoug Prognosis poor   Electronic Signatures: Lujean Amel D (MD)  (Signed 07-Sep-15 07:51)  Authored: Chief Complaint, VITAL SIGNS/ANCILLARY NOTES, Brief Assessment, Lab Results, Radiology Results, Assessment/Plan   Last Updated: 07-Sep-15 07:51 by Yolonda Kida (MD)

## 2014-06-29 NOTE — Consult Note (Signed)
PATIENT NAME:  Abigail KellerKUENN, Leisel B MR#:  [id number] DATE OF BIRTH:  Aug 02, 1927  DATE OF CONSULTATION:  11/08/2013  CONSULTING PHYSICIAN:  Dwayne D. Callwood, MD  INDICATION: Congestive heart failure.   HISTORY OF PRESENT ILLNESS: The patient is an 81 year old white female with past medical history of chronic diastolic congestive heart failure, severe atrial fibrillation, who presented to the hospital with complaints of shortness of breath. Apparently, the patient was doing well up until a week ago when she started having worsening shortness of breath, significant lower extremity edema, some pulmonary congestion on x-ray, saturations in the low 90s. She was noted to have atrial fibrillation with rapid ventricular response at 140.  In the emergency room, she was advised to be admitted for further evaluation and care.   PAST MEDICAL HISTORY: Significant for Alzheimer dementia, hypertension, rheumatic fever as a child, vein stripping, mastectomy, hysterectomy, elbow repair.   ALLERGIES: DEMEROL, SULFA, TOPROL.    SOCIAL HISTORY: The patient lives at home with her son; able to feed herself, otherwise, she needs full assistance.    FAMILY HISTORY: Hypertension.   REVIEW OF SYSTEMS: Unobtainable because of dementia.   PHYSICAL EXAMINATION: VITAL SIGNS: Blood pressure was 110/90, pulse of 120 and regular, respiratory rate of 16, afebrile.  HEENT: Normocephalic, atraumatic. Pupils equal and reactive to light.  NECK: Supple. No significant JVD, bruits or adenopathy.  LUNGS: Bilateral rhonchi. Faint rales in the bases. Adequate air movement. No wheezing.  HEART: Irregularly irregular. Systolic ejection murmur along the apex.  ABDOMEN: Benign.  EXTREMITIES:  Within normal limits, 2+ edema. NEUROLOGIC: Intact.  SKIN: Normal.   LABORATORY DATA: BNP 6800. BUN and creatinine are 22 and 0.9, sodium 133, potassium 3.3, magnesium 1.9, glucose of 112. LFTs negative. Troponin 0.02. White count of 10.7,  hemoglobin of 13.5, platelet count of 152.  IMAGING: Chest x-ray bibasilar as well as patchy airspace disease consistent with possible heart failure.  EKG: Rapid atrial fibrillation, rate of about 140, nonspecific finding.   ASSESSMENT: 1.  Atrial fibrillation.  2.  Congestive heart failure. 3.  History of hypertension. 4.  Edema.  5.  Hypokalemia. 6.  Dementia.   PLAN: Agree with admit. Rule out for myocardial infarction. Follow up cardiac enzymes. Follow-up the EKG. Consider rate control for atrial fibrillation.  I am not sure she is a good anticoagulation candidate. Continue telemetry. Consider diuretics for heart failure.  May try low-dose ACE inhibitor to help with heart failure symptoms. Echocardiogram will be helpful. Replace potassium. Continue treatment for dementia. The patient is on Haldol, not able to give much of a history. She has also been on respirdal.  Would continue medical therapy for now. We will alert Dr. Evette GeorgesParashos that his patient is here in the hospital. We will continue to follow and maintain a conservative cardiology presence for rate control for atrial fibrillation as well as treatment for heart failure.    ____________________________ Bobbie Stackwayne D. Juliann Paresallwood, MD ddc:lr D: 11/08/2013 17:42:52 ET T: 11/08/2013 18:13:42 ET JOB#: [id number]  cc: Dwayne D. Juliann Paresallwood, MD, <Dictator> Alwyn PeaWAYNE D CALLWOOD MD ELECTRONICALLY SIGNED 12/04/2013 14:53

## 2014-06-29 NOTE — Consult Note (Signed)
Chief Complaint:  Subjective/Chief Complaint Altered mental status see restaurant failure still requiring BiPAP still lethargic denies any pain. not improving as well .   VITAL SIGNS/ANCILLARY NOTES: **Vital Signs.:   07-Sep-15 15:00  Temperature Temperature (F) 98.3  Celsius 36.8  Pulse Pulse 108  Respirations Respirations 20  Systolic BP Systolic BP 093  Diastolic BP (mmHg) Diastolic BP (mmHg) 64  Mean BP 90  Pulse Ox % Pulse Ox % 100  Oxygen Delivery High Flow Nasal Cannula  *Intake and Output.:   Shift 07-Sep-15 15:00  Grand Totals Intake:  410 Output:  1100    Net:  -20 24 Hr.:  -690  IV (Secondary)      In:  250  IV (Secondary)      In:  50  IV (Secondary)      In:  110  Urine ml     Out:  1100  Length of Stay Totals Intake:  2042.62 Output:  14350    Net:  -12307.38   Brief Assessment:  GEN well developed, well nourished, thin, critically ill appearing   Cardiac Irregular  murmur present  -- JVD  --Rub  --Gallop   Respiratory normal resp effort  rhonchi   Gastrointestinal Normal   Gastrointestinal details normal Soft   EXTR negative cyanosis/clubbing, negative edema   Lab Results: Routine Chem:  05-Sep-15 04:00   Chloride, Serum 107  07-Sep-15 03:43   Result Comment LABS - This specimen was collected through an   - indwelling catheter or arterial line.  - A minimum of 76ms of blood was wasted prior    - to collecting the sample.  Interpret  - results with caution.  Result(s) reported on 12 Nov 2013 at 04:01AM.  Glucose, Serum 65  BUN  20  Creatinine (comp) 1.05  Sodium, Serum 139  Potassium, Serum  3.2  Chloride, Serum 100  CO2, Serum 31  Calcium (Total), Serum 8.6  Anion Gap 8  Osmolality (calc) 278  eGFR (African American)  56  eGFR (Non-African American)  49 (eGFR values <640mmin/1.73 m2 may be an indication of chronic kidney disease (CKD). Calculated eGFR is useful in patients with stable renal function. The eGFR calculation will not  be reliable in acutely ill patients when serum creatinine is changing rapidly. It is not useful in  patients on dialysis. The eGFR calculation may not be applicable to patients at the low and high extremes of body sizes, pregnant women, and vegetarians.)  Magnesium, Serum 1.8 (1.8-2.4 THERAPEUTIC RANGE: 4-7 mg/dL TOXIC: > 10 mg/dL  -----------------------)  Phosphorus, Serum 3.4 (Result(s) reported on 12 Nov 2013 at 04:03AM.)   Radiology Results: XRay:    02-Sep-15 18:22, Chest Portable Single View  Chest Portable Single View   REASON FOR EXAM:    dyspnea  COMMENTS:       PROCEDURE: DXR - DXR PORTABLE CHEST SINGLE VIEW  - Nov 07 2013  6:22PM     CLINICAL DATA:  Shortness of breath and dyspnea.    EXAM:  PORTABLE CHEST - 1 VIEW    COMPARISON:  09/11/2013.    FINDINGS:  1819 hrs. Lung volumes are low. There is patchy bilateral asymmetric  airspace disease. Bilateral pleural effusions are evident, right  greater than left. The cardio pericardial silhouette is enlarged.  Imaged bony structures of the thorax are intact. Telemetry leads  overlie the chest.     IMPRESSION:  Patchy asymmetric bilateral airspace opacity with bibasilar  collapse/ consolidation and bilateral pleural effusions,  right  greater than left. Imaging features may be related to diffuse  infection although asymmetric pulmonary edema could have this  appearance.      Electronically Signed    By: Misty Stanley M.D.    On: 11/07/2013 18:55     Verified By: ERIC A. MANSELL, M.D.,    03-Sep-15 16:43, Chest Portable Single View  Chest Portable Single View   REASON FOR EXAM:    hypoxic  COMMENTS:       PROCEDURE: DXR - DXR PORTABLE CHEST SINGLE VIEW  - Nov 08 2013  4:43PM     CLINICAL DATA:  Hypoxia    EXAM:  PORTABLE CHEST - 1 VIEW    COMPARISON:  11/07/1948    FINDINGS:  Cardiac shadow is prominent butstable. Endotracheal tube is now  seen 2.7 cm above the carina. Bilateral pleural effusions are  noted  right greater than left. Patchy bilateral infiltrates are again  identified similar to that seen on the prior exam.     IMPRESSION:  Status post intubation in satisfactory position. The remainder of  the exam is stable.      Electronically Signed    By: Inez Catalina M.D.    On: 11/08/2013 16:47         Verified By: Everlene Farrier, M.D.,    03-Sep-15 18:50, Chest Portable Single View  Chest Portable Single View   REASON FOR EXAM:    PICC Line Placement  COMMENTS:       PROCEDURE: DXR - DXR PORTABLE CHEST SINGLE VIEW  - Nov 08 2013  6:50PM     CLINICAL DATA:  PICC line placement.    EXAM:  PORTABLE CHEST - 1 VIEW    COMPARISON:  Same day.    FINDINGS:  Endotracheal and nasogastric tubes are in grossly good position.  Interval placement of left-sided PICC line with distal tip in the  right atrium. Bilateral pleural effusions are noted with right  greater than left. Central pulmonary vascular congestion and  bilateral perihilar edema is noted. No pneumothorax is noted.     IMPRESSION:  Stable bilateral pleural effusions and pulmonary edema compared to  prior exam. Interval placement of left-sided PICC line with distal  tip in the right atrium. Is recommended to be withdrawn at least 2-3  cm. These results will be called to the ordering clinician or  representative by the Radiologist Assistant, and communication  documented in the PACS or zVision Dashboard.      Electronically Signed    By: Sabino Dick M.D.    On: 11/08/2013 19:03         Verified By: Marveen Reeks, M.D.,    05-Sep-15 08:52, Chest Portable Single View  Chest Portable Single View   REASON FOR EXAM:    SOB, cough.  COMMENTS:       PROCEDURE: DXR - DXR PORTABLE CHEST SINGLE VIEW  - Nov 10 2013  8:52AM     CLINICAL DATA:  Shortness of breath, cough    EXAM:  PORTABLE CHEST - 1 VIEW    COMPARISON:  11/08/2013    FINDINGS:  New focal opacity in the right upper lobe, suspicious for  right  upper lobe pneumonia.  Superimposed mild interstitial edema.    Moderate right and small left pleural effusions.    Cardiomegaly.    Left arm PICC terminating at the cavoatrial junction.    Interval extubation.     IMPRESSION:  New focal  right upper lobe opacity, suspicious for pneumonia.    Cardiomegaly with mild interstitial edema. Moderate right and small  left pleural effusion.  Interval extubation.      Electronically Signed    By: Julian Hy M.D.    On: 11/10/2013 08:59         Verified By: Julian Hy, M.D.,    08-Sep-15 08:41, Chest Portable Single View  Chest Portable Single View   REASON FOR EXAM:    f/u aspiration pneumonia and chf  COMMENTS:       PROCEDURE: DXR - DXR PORTABLE CHEST SINGLE VIEW  - Nov 13 2013  8:41AM     CLINICAL DATA:  CHF, aspiration pneumonia, followup    EXAM:  PORTABLE CHEST - 1 VIEW    COMPARISON:  Portable exam 0831 hr compared to 11/10/2013    FINDINGS:  LEFT arm PICC line unchanged.  Enlargement of cardiac silhouette with pulmonary vascular  congestion.    Diffuse BILATERAL pulmonary infiltrates consistent with pulmonary  edema and CHF.    Bibasilar effusions and atelectasis greater on RIGHT.    Improved focal consolidation in RIGHT upper lobe.    No pneumothorax.    Bones demineralized.     IMPRESSION:  Persistent CHF with bibasilar atelectasis and pleural effusions  greater on RIGHT.    Improved RIGHT upper lobe consolidation.      Electronically Signed    By: Lavonia Dana M.D.    On: 11/13/2013 08:44         Verified By: Burnetta Sabin, M.D.,    10-Sep-15 06:01, Chest Portable Single View  Chest Portable Single View   REASON FOR EXAM:    ASPIRATION pna  COMMENTS:       PROCEDURE: DXR - DXR PORTABLE CHEST SINGLE VIEW  - Nov 15 2013  6:01AM     CLINICAL DATA:  Aspiration pneumonia    EXAM:  PORTABLE CHEST - 1 VIEW    COMPARISON:  Prior chest x-ray  11/13/2013    FINDINGS:  The cardiopericardial silhouette is largely obscured by bilateral  right worse than left airspace opacities. There is a moderately  large layering right pleural effusion. Two lucencies overlying the  costophrenic angle may represent regions of improved pulmonary  parenchymal aeration or a small amount of loculated hydro  pneumothorax. Persistent smaller layering left pleural effusion and  associated left basilar opacity. There has been interval improvement  in a background of pulmonary vascular congestion. Atherosclerotic  calcifications again noted in the transverse aorta. No definite  pneumothorax. No acute osseous abnormality.     IMPRESSION:  1. Improving CHF with decreased edema and vascular congestion.  2. Persistent right larger than left layering effusions and  associated bibasilar atelectasis versus infiltrate.  3. Two rounded lucencies overlying the right lung base may represent  areas of improving pulmonary parenchymal aeration or a small volume  of loculated hydro pneumothorax. No evidence to support pneumothorax  at the lung apex or in the periphery of the lung.  4. Left upper extremity approach PICC in good position with the tip  at the superior cavoatrial junction.      Electronically Signed    By: Jacqulynn Cadet M.D.    On: 11/15/2013 07:54         Verified By: Criselda Peaches, M.D.,    10-Sep-15 08:24, Chest Portable Single View  Chest Portable Single View   REASON FOR EXAM:    chf  COMMENTS:  PROCEDURE: DXR - DXR PORTABLE CHEST SINGLE VIEW  - Nov 15 2013  8:24AM     CLINICAL DATA:  CHF    EXAM:  PORTABLE CHEST - 1 VIEW    COMPARISON:  Chest x-ray obtained earlier today at 5:56 a.m. Min    FINDINGS:  Slightly increased pulmonary vascular congestion compared to earlier  this morning. Otherwise, the appearance of the lungs is unchanged  including right larger than left layering pleural effusions with 2 0  rounded  lucencies projecting over the right lung base. Stable  position of left upper extremity PICC with the tip at the superior  cavoatrial junction. Ectatic and atherosclerotic thoracic aorta. No  pneumothorax. No acute osseous abnormality.     IMPRESSION:  1. Slightly increased pulmonary vascular congestion compared to  earlier this morning.  2. Otherwise, unchanged appearance of the chest.      Electronically Signed    By: Jacqulynn Cadet M.D.    On: 11/15/2013 08:27     Verified By: Criselda Peaches, M.D.,  Cardiology:    02-Sep-15 18:05, ECG  Ventricular Rate 147  Atrial Rate 153  QRS Duration 102  QT 328  QTc 513  R Axis 0  T Axis 155  ECG interpretation   Atrial fibrillation with rapid ventricular response with premature ventricular or aberrantly conducted complexes  Septal infarct , age undetermined  Abnormal ECG  When compared with ECG of 11-Sep-2013 20:38,  Atrial fibrillation has replaced Junctional rhythm  Vent. rate has increased BY 103 BPM  Septal infarct is now Present  T wave inversion no longer evident in Inferior leads  T wave inversion no longer evident in Anterior leads  T wave inversion more evident in Lateral leads  ----------unconfirmed----------  Confirmed by OVERREAD, NOT (100), editor PEARSON, BARBARA ([id number]) on 11/08/2013 8:16:23 AM  ECG     02-Sep-15 19:56, ECG  Ventricular Rate 124  Atrial Rate 131  QRS Duration 100  QT 348  QTc 499  R Axis -25  T Axis 133  ECG interpretation   Atrial fibrillation with rapid ventricular response with premature ventricular or aberrantly conducted complexes  Septal infarct (cited on or before 07-Nov-2013)  Abnormal ECG  When compared with ECG of 07-Nov-2013 18:05,  Nonspecific T wave abnormality has replaced inverted T waves in Lateral leads  ----------unconfirmed----------  Confirmed by OVERREAD, NOT (100), editor PEARSON, BARBARA (32) on 11/08/2013 8:16:32 AM  ECG     04-Sep-15 14:49, Echo Doppler  Echo  Doppler   REASON FOR EXAM:      COMMENTS:       PROCEDURE: [place] - ECHO DOPPLER COMPLETE(TRANSTHOR)  - Nov 09 2013  2:49PM     RESULT: Echocardiogram Report    Patient Name:   Abigail Dougherty Date of Exam: 11/09/2013  Medical Rec #:  [id number]          Custom1:  Dateof Birth:  [birth year]        Height:       70.0 in  Patient Age:    81 years        Weight:       176.0 lb  Patient Gender: F               BSA:          1.98 m??    Indications: Atrial Fib  Sonographer:    Sherrie Sport RDCS  Referring Phys: Ether Griffins, [place]    Summary:   1. Left ventricular  ejection fraction, by visual estimation, is 50 to   55%.   2. Low normal global left ventricular systolic function.   3. Mildly dilated left atrium.   4. Moderate pleural effusion in the left lateral region.   5. Mild mitral valve regurgitation.   6. Mild aortic regurgitation.   7. Mild to moderate tricuspid regurgitation.  2D AND M-MODE MEASUREMENTS (normal ranges within parentheses):  Left Ventricle:          Normal  IVSd (2D):      1.33 cm (0.7-1.1)  LVPWd (2D):     1.04 cm (0.7-1.1) Aorta/LA:                  Normal  LVIDd (2D):     3.69 cm (3.4-5.7) Aortic Root (2D): 3.60 cm (2.4-3.7)  LVIDs (2D):     2.54 cm           Left Atrium (2D): 4.40 cm (1.9-4.0)  LV FS (2D):     31.2 %   (>25%)  LV EF (2D):59.8 %   (>50%)                                    Right Ventricle:                                    RVd (2D):        4.31 cm  LV DIASTOLIC FUNCTION:  MV Peak E: 1.30 m/s E/e' Ratio: 21.80                      Decel Time: 194 msec  SPECTRAL DOPPLERANALYSIS (where applicable):  Mitral Valve:  MV P1/2 Time: 56.26 msec  MV Area, PHT: 3.91 cm??  Aortic Valve: AoV Max Vel: 1.85 m/s AoV Peak PG: 13.7 mmHg AoV Mean PG:   7.5 mmHg  LVOT Vmax: 1.38 m/s LVOT VTI: 0.232 m LVOT Diameter: 2.00 cm  AoV Area,Vmax: 2.34 cm?? AoV Area, VTI: 2.62 cm?? AoV Area, Vmn: 2.31 cm??  Tricuspid Valve and PA/RV Systolic Pressure: TR Max Velocity: 2.88 m/s RA    Pressure: 5 mmHg RVSP/PASP: 38.3 mmHg  Pulmonic Valve:  PV Max Velocity: 0.92 m/s PV Max PG: 3.4 mmHg PV Mean PG:    PHYSICIAN INTERPRETATION:  Left Ventricle: The left ventricular internal cavity size was normal. LV   septal wall thickness was normal. LV posterior wall thickness was normal.   Global LV systolic function was low normal. Left ventricular ejection   fraction, by visual estimation, is 50 to 55%.  Right Ventricle: The right ventricular size is normal. Global RV systolic   function is normal.  Left Atrium: The left atrium is mildly dilated.  Right Atrium: The right atrium is normal in size.  Pericardium: There is no evidence of pericardial effusion. There is a   moderate pleural effusion in the left lateral region.  Mitral Valve: The mitral valve is normal in structure. Mild mitral valve   regurgitation is seen.  Tricuspid Valve: The tricuspid valve is normal. Mild to moderate   tricuspid regurgitation is visualized. The tricuspid regurgitant velocity   is 2.88 m/s, and with an assumed right atrial pressure of 5 mmHg, the   estimated right ventricular systolic pressure is normal at 38.3 mmHg.  Aortic Valve: The aortic valve is normal. Mild aortic valve  regurgitation   is seen.  Pulmonic Valve: The pulmonic valve is normal.    Lena MD  Electronically signed by [place] MD  Signature Date/Time: 11/11/2013/7:29:04 PM    *** Final ***    IMPRESSION: .        Verified By: Yolonda Kida, M.D., MD   Assessment/Plan:  Invasive Device Daily Assessment of Necessity:  Does the patient currently have any of the following indwelling devices? foley   Indwelling Urinary Catheter continued, requirement due to   Reason to continue Indwelling Urinary Catheter for strict Intake/Output monitoring for hemodynamic instability   Assessment/Plan:  Assessment IMP AMS SOB Aspiration  Resp Failure Hypotension AFIB Dementia Elevated  Glucose Weakness .   Plan PLAN Agree with palliative care Agree with ICU care Continue resp support Inhalers as necessary 02 via mask Rate control for AFIB with Digoxin Wean pressors if possible Continue dementia therapy Pulmonary support by suctioning Short term anticoug Prognosis poor   Electronic Signatures: Lujean Amel D (MD)  (Signed 18-Sep-15 17:59)  Authored: Chief Complaint, VITAL SIGNS/ANCILLARY NOTES, Brief Assessment, Lab Results, Radiology Results, Assessment/Plan   Last Updated: 18-Sep-15 17:59 by Yolonda Kida (MD)

## 2014-06-29 NOTE — Discharge Summary (Signed)
PATIENT NAME:  [name], Abigail Dougherty, Abigail Dougherty  DATE OF ADMISSION:  11/07/2013 DATE OF DISCHARGE:    INTERIM DISCHARGE SUMMARY:  PRIMARY CARE PHYSICIAN: Unknown.  CONSULTATIONS: Pulmonary, Dr. Belia HemanKasa; cardiology, Dr. Juliann Paresallwood.   CURRENT DIAGNOSES:  1. Acute respiratory failure.  2. Aspiration pneumonia.  3. Acute on chronic diastolic congestive heart failure.  4. Atrial fibrillation with rapid ventricular response.  5. Hypotension.  6. Hypokalemia.  7. Dementia.  8. Anemia.   REASON FOR ADMISSION: Worsening shortness of breath.   HOSPITAL COURSE: The patient is an [age] year old Caucasian female with a history of chronic diastolic CHF, atrial fibrillation presented to the ED with worsening shortness of breath. The patient's chest x-ray showed pulmonary edema. Oxygen saturation was 91% on room air. The patient was also noticed to have atrial fibrillation with RVR at 140. For detailed history and physical examination, please refer to the admission note dictated by Dr. Winona LegatoVaickute.  LABORATORY DATA: Showed BNP 6790, BUN 22, creatinine 0.92, sodium 133, potassium 3.3, magnesium 1.9, troponin 0.02,   HOSPITAL COURSE: 1. For acute on chronic congestive heart failure, the patient has been treated with Lasix IV, but the symptoms have been worsening.  2. For atrial fibrillation with rapid ventricular response, the patient was initially admitted to telemetry floor. The patient was treated with Cardizem p.o. with IV p.r.n.; however, the patient developed hypotension so Cardizem was discontinued. The patient has been treated with digoxin IV. Heart rate is better.  3. Acute on chronic diastolic congestive heart failure. The patient has been treated with Lasix 20 mg IV t.i.d. Since the patient developed hypotension, Lasix was discontinued to 20 mg b.i.d.  4. Acute respiratory failure with aspiration pneumonia. The patient aspirated food on the second day of admission, developed  acute respiratory failure. The patient was intubated and transferred to critical care unit and was placed on ventilation for 1 day and then extubated. The patient was transferred to floor with oxygen by nasal cannula 4 liters. However, the patient developed acute respiratory failure again and placed on BiPAP, transferred to critical care unit. Now she is on high flow oxygen. 5. For aspiration pneumonia, the patient has been treated with Zosyn and vancomycin. The patient's WBC is in normal range.   CODE STATUS: The patient's son wants the patient on full code. The patient has a high risk for cardiac arrest.    ____________________________ Shaune PollackQing Dalicia Kisner, MD qc:lt D: 11/12/2013 15:22:22 ET T: 11/12/2013 21:38:52 ET JOB#: [id number]  cc: Shaune PollackQing Leshia Kope, MD, <Dictator> Shaune PollackQING Jaclin Finks MD ELECTRONICALLY SIGNED 11/16/2013 12:51

## 2014-11-26 ENCOUNTER — Inpatient Hospital Stay
Admission: EM | Admit: 2014-11-26 | Discharge: 2014-11-28 | DRG: 379 | Disposition: A | Discharge status: Skilled Nursing Facility | Payer: Medicare Other | Attending: Internal Medicine | Admitting: Internal Medicine

## 2014-11-26 ENCOUNTER — Encounter: Payer: Self-pay | Admitting: Emergency Medicine

## 2014-11-26 DIAGNOSIS — Z823 Family history of stroke: Secondary | ICD-10-CM | POA: Diagnosis not present

## 2014-11-26 DIAGNOSIS — Z66 Do not resuscitate: Secondary | ICD-10-CM | POA: Diagnosis present

## 2014-11-26 DIAGNOSIS — I509 Heart failure, unspecified: Secondary | ICD-10-CM | POA: Diagnosis present

## 2014-11-26 DIAGNOSIS — I482 Chronic atrial fibrillation: Secondary | ICD-10-CM | POA: Diagnosis present

## 2014-11-26 DIAGNOSIS — K219 Gastro-esophageal reflux disease without esophagitis: Secondary | ICD-10-CM | POA: Diagnosis present

## 2014-11-26 DIAGNOSIS — K922 Gastrointestinal hemorrhage, unspecified: Secondary | ICD-10-CM | POA: Diagnosis present

## 2014-11-26 DIAGNOSIS — Z803 Family history of malignant neoplasm of breast: Secondary | ICD-10-CM | POA: Diagnosis not present

## 2014-11-26 DIAGNOSIS — Z8249 Family history of ischemic heart disease and other diseases of the circulatory system: Secondary | ICD-10-CM

## 2014-11-26 DIAGNOSIS — Z9181 History of falling: Secondary | ICD-10-CM

## 2014-11-26 DIAGNOSIS — K921 Melena: Secondary | ICD-10-CM | POA: Diagnosis present

## 2014-11-26 DIAGNOSIS — I10 Essential (primary) hypertension: Secondary | ICD-10-CM | POA: Diagnosis present

## 2014-11-26 DIAGNOSIS — I4891 Unspecified atrial fibrillation: Secondary | ICD-10-CM | POA: Diagnosis present

## 2014-11-26 DIAGNOSIS — G309 Alzheimer's disease, unspecified: Secondary | ICD-10-CM | POA: Diagnosis present

## 2014-11-26 DIAGNOSIS — E785 Hyperlipidemia, unspecified: Secondary | ICD-10-CM | POA: Diagnosis present

## 2014-11-26 DIAGNOSIS — F0280 Dementia in other diseases classified elsewhere without behavioral disturbance: Secondary | ICD-10-CM | POA: Diagnosis present

## 2014-11-26 DIAGNOSIS — Z9010 Acquired absence of unspecified breast and nipple: Secondary | ICD-10-CM | POA: Diagnosis present

## 2014-11-26 DIAGNOSIS — L8990 Pressure ulcer of unspecified site, unspecified stage: Secondary | ICD-10-CM | POA: Diagnosis present

## 2014-11-26 DIAGNOSIS — Z87891 Personal history of nicotine dependence: Secondary | ICD-10-CM

## 2014-11-26 DIAGNOSIS — E876 Hypokalemia: Secondary | ICD-10-CM | POA: Diagnosis present

## 2014-11-26 DIAGNOSIS — H8109 Meniere's disease, unspecified ear: Secondary | ICD-10-CM | POA: Diagnosis present

## 2014-11-26 HISTORY — DX: Heart failure, unspecified: I50.9

## 2014-11-26 HISTORY — DX: Diaphragmatic hernia without obstruction or gangrene: K44.9

## 2014-11-26 HISTORY — DX: Meniere's disease, unspecified ear: H81.09

## 2014-11-26 HISTORY — DX: Hyperlipidemia, unspecified: E78.5

## 2014-11-26 HISTORY — DX: Iron deficiency anemia, unspecified: D50.9

## 2014-11-26 HISTORY — DX: Alzheimer's disease, unspecified: G30.9

## 2014-11-26 HISTORY — DX: Gastro-esophageal reflux disease without esophagitis: K21.9

## 2014-11-26 HISTORY — DX: Essential (primary) hypertension: I10

## 2014-11-26 HISTORY — DX: Dementia in other diseases classified elsewhere, unspecified severity, without behavioral disturbance, psychotic disturbance, mood disturbance, and anxiety: F02.80

## 2014-11-26 HISTORY — DX: Unspecified atrial fibrillation: I48.91

## 2014-11-26 LAB — CBC WITH DIFFERENTIAL/PLATELET
BASOS PCT: 1 %
Basophils Absolute: 0.1 10*3/uL (ref 0–0.1)
EOS ABS: 0 10*3/uL (ref 0–0.7)
Eosinophils Relative: 0 %
HEMATOCRIT: 39.2 % (ref 35.0–47.0)
Hemoglobin: 12.6 g/dL (ref 12.0–16.0)
LYMPHS ABS: 1.1 10*3/uL (ref 1.0–3.6)
Lymphocytes Relative: 10 %
MCH: 28.7 pg (ref 26.0–34.0)
MCHC: 32.2 g/dL (ref 32.0–36.0)
MCV: 88.9 fL (ref 80.0–100.0)
MONO ABS: 0.8 10*3/uL (ref 0.2–0.9)
MONOS PCT: 7 %
Neutro Abs: 8.7 10*3/uL — ABNORMAL HIGH (ref 1.4–6.5)
Neutrophils Relative %: 82 %
Platelets: 194 10*3/uL (ref 150–440)
RBC: 4.41 MIL/uL (ref 3.80–5.20)
RDW: 14 % (ref 11.5–14.5)
WBC: 10.7 10*3/uL (ref 3.6–11.0)

## 2014-11-26 LAB — BASIC METABOLIC PANEL
Anion gap: 6 (ref 5–15)
BUN: 20 mg/dL (ref 6–20)
CALCIUM: 9.1 mg/dL (ref 8.9–10.3)
CO2: 28 mmol/L (ref 22–32)
CREATININE: 0.73 mg/dL (ref 0.44–1.00)
Chloride: 106 mmol/L (ref 101–111)
GFR calc Af Amer: >60 mL/min (ref >60)
GFR calc non Af Amer: >60 mL/min (ref >60)
GLUCOSE: 125 mg/dL — AB (ref 65–99)
Potassium: 4 mmol/L (ref 3.5–5.1)
Sodium: 140 mmol/L (ref 135–145)

## 2014-11-26 LAB — TYPE AND SCREEN
ABO/RH(D): O POS
ANTIBODY SCREEN: NEGATIVE

## 2014-11-26 LAB — PROTIME-INR
INR: 1.06
Prothrombin Time: 14 seconds (ref 11.4–15.0)

## 2014-11-26 LAB — ABO/RH: ABO/RH(D): O POS

## 2014-11-26 LAB — APTT: aPTT: 27 seconds (ref 24–36)

## 2014-11-26 MED ORDER — SODIUM CHLORIDE 0.9 % IV BOLUS (SEPSIS)
500.0000 mL | Freq: Once | INTRAVENOUS | Status: AC
  Administered 2014-11-26: 500 mL via INTRAVENOUS

## 2014-11-26 NOTE — ED Provider Notes (Signed)
[facility] Emergency Department Provider Note  ____________________________________________  Time seen: Approximately 9:36 PM  I have reviewed the triage vital signs and the nursing notes.   HISTORY  Chief Complaint Rectal Bleeding  Melena  Patient history is limited due to her dementia  HPI Abigail Dougherty is a 81 y.o. female with a history of dementia, A. fib not anticoagulated, on aspirin, sent for melena. Per report,was changing the patient's diaper prior to going to bed and saw a large amount of dark bloody stool. Patient is unable to answer questions about symptoms, but states that she is not in any pain at this time.   Past Medical History  Diagnosis Date  . Atrial fibrillation   . Heart failure   . Hypertension   . Iron deficiency anemia   . Alzheimer disease    A. Fib Dementia    There are no active problems to display for this patient.   No past surgical history on file.  Current Outpatient Rx  Name  Route  Sig  Dispense  Refill  . acetaminophen (TYLENOL) 500 MG tablet   Oral   Take 500 mg by mouth every 6 (six) hours as needed.         Marland Kitchen amLODipine (NORVASC) 2.5 MG tablet   Oral   Take 2.5 mg by mouth daily.         Marland Kitchen aspirin 325 MG EC tablet   Oral   Take 325 mg by mouth daily.         . cholecalciferol (VITAMIN D) 400 UNITS TABS tablet   Oral   Take 50,000 Units by mouth every 30 (thirty) days.         . digoxin (LANOXIN) 0.125 MG tablet   Oral   Take 0.125 mg by mouth daily.         Marland Kitchen donepezil (ARICEPT) 10 MG tablet   Oral   Take 10 mg by mouth at bedtime.         . ferrous sulfate 325 (65 FE) MG tablet   Oral   Take 325 mg by mouth 2 (two) times daily with a meal.         . furosemide (LASIX) 20 MG tablet   Oral   Take 10 mg by mouth.         . memantine (NAMENDA XR) 28 MG CP24 24 hr capsule   Oral   Take 28 mg by mouth.         . nystatin (MYCOSTATIN/NYSTOP) 100000 UNIT/GM POWD  Topical   Apply topically 2 (two) times daily.         Marland Kitchen omega-3 acid ethyl esters (LOVAZA) 1 G capsule   Oral   Take 1 g by mouth daily.         . potassium chloride (K-DUR,KLOR-CON) 10 MEQ tablet   Oral   Take 10 mEq by mouth at bedtime.         . vitamin E 400 UNIT capsule   Oral   Take 400 Units by mouth daily.           Allergies Meperidine and related; Streptomycin; Sulfa antibiotics; and Toprol xl  No family history on file.  Social History Social History  Substance Use Topics  . Smoking status: Never Smoker   . Smokeless tobacco: None  . Alcohol Use: No    Review of Systems Unable to obtain due to patient dementia  10-point ROS otherwise negative.  ____________________________________________  PHYSICAL EXAM:  VITAL SIGNS: ED Triage Vitals  Enc Vitals Group     BP --      Pulse --      Resp --      Temp --      Temp src --      SpO2 --      Weight --      Height --      Head Cir --      Peak Flow --      Pain Score --      Pain Loc --      Pain Edu? --      Excl. in GC? --     Constitutional: Alert andanswering questions. In no acute distress Eyes: Conjunctivae are normal.  EOMI. Head: Atraumatic. Nose: No congestion/rhinnorhea. Mouth/Throat: Mucous membranes are dry.  Neck: No stridor.  Supple.   Cardiovascular: Normal rate, regular rhythm. No murmurs, rubs or gallops.  Respiratory: Normal respiratory effort.  No retractions. Lungs CTAB.  No wheezes, rales or ronchi. Gastrointestinal: Soft and nontender. No distention. No peritoneal signs. Genitourinary: Dark, bloody foul-smelling stool that is guaiac positive. Musculoskeletal: No LE edema.  Neurologic:  Normal speech and language. No gross focal neurologic deficits are appreciated.  Skin:  Skin is warm, dry and intact. No rash noted. Psychiatric: Mood and affect are normal.  ____________________________________________   LABS (all labs ordered are listed, but only abnormal  results are displayed)  Labs Reviewed  BASIC METABOLIC PANEL - Abnormal; Notable for the following:    Glucose, Bld 125 (*)    All other components within normal limits  CBC WITH DIFFERENTIAL/PLATELET - Abnormal; Notable for the following:    Neutro Abs 8.7 (*)    All other components within normal limits  PROTIME-INR  APTT  TYPE AND SCREEN  ABO/RH   ____________________________________________  EKG  Atrial fib with bradycardia; no ST changes. ____________________________________________  RADIOLOGY  No results found.  ____________________________________________   PROCEDURES  Procedure(s) performed: None  Critical Care performed: No ____________________________________________   INITIAL IMPRESSION / ASSESSMENT AND PLAN / ED COURSE  Pertinent labs & imaging results that were available during my care of the patient were reviewed by me and considered in my medical decision making (see chart for details).  81 y.o. female not anticoagulated presenting with melanoma. I'm concerned for lower GI bleed, consider upper GI bleed. At this time the patient is hemodynamically stable. Labs have been sent, patient will receive small IV fluid bolus and will be admitted to the hospitalist service.  ----------------------------------------- 11:29 PM on 11/26/2014 -----------------------------------------  Patient is resting comfortably, but when awaken blood pressure improves from 100/51-137/57. Her hematocrit was 39, and there is no more episodes of melena. Pt is receiving IVF, there is no indication for transfusion at this time.  ____________________________________________  FINAL CLINICAL IMPRESSION(S) / ED DIAGNOSES  Final diagnoses:  Gastrointestinal hemorrhage with melena      NEW MEDICATIONS STARTED DURING THIS VISIT:  New Prescriptions   No medications on file     Rockne Menghini, MD 11/26/14 2330

## 2014-11-26 NOTE — ED Notes (Signed)
Pt to room 10 via stretcher by [facility]; pt from [facility]; EMS reports staff was putting pt to bed PTA and noted rectal bleeding with clots; no hx of same

## 2014-11-26 NOTE — H&P (Signed)
[facility] [place] at [facility]   PATIENT NAME: Abigail Dougherty    MR#:  [id number]  DATE OF BIRTH:  11-01-1927  DATE OF ADMISSION:  11/26/2014  PRIMARY CARE PHYSICIAN: No primary care provider on file.   REQUESTING/REFERRING PHYSICIAN: Sharma Covert, MD  CHIEF COMPLAINT:   Chief Complaint  Patient presents with  . Rectal Bleeding    HISTORY OF PRESENT ILLNESS:  Abigail Dougherty  is a 81 y.o. female who presents with melena. Patient resides at a nursing facility, and was found to have melena by staff there and was sent to the ED. In the ED her evaluation was stable. Her blood pressure is noted, her hemoglobin is 12, other labs are benign. However, she did have another large melanotic bowel movement while in the ED. Patient is severely demented and cannot contribute to her own history, history is taken by ED staff spoke with family and by collateral information from the nursing facility. Patient does not seem to be in any kind of pain. Hospitalists were called for admission for GI bleed.  PAST MEDICAL HISTORY:   Past Medical History  Diagnosis Date  . Atrial fibrillation   . Heart failure   . Hypertension   . Iron deficiency anemia   . Alzheimer disease   . Meniere disease   . Hiatal hernia   . HLD (hyperlipidemia)   . GERD (gastroesophageal reflux disease)     PAST SURGICAL HISTORY:   Past Surgical History  Procedure Laterality Date  . Mastectomy      SOCIAL HISTORY:   Social History  Substance Use Topics  . Smoking status: Former Games developer  . Smokeless tobacco: Not on file  . Alcohol Use: No    FAMILY HISTORY:   Family History  Problem Relation Age of Onset  . Stroke Mother   . Hypertension Mother   . Aortic aneurysm Mother   . Breast cancer Mother   . Hypertension Father   . Cancer Father     DRUG ALLERGIES:   Allergies  Allergen Reactions  . Meperidine And Related   . Streptomycin   . Sulfa Antibiotics   . Toprol Xl [Metoprolol  Tartrate]     MEDICATIONS AT HOME:   Prior to Admission medications   Medication Sig Start Date End Date Taking? Authorizing Provider  acetaminophen (TYLENOL) 500 MG tablet Take 500 mg by mouth every 6 (six) hours as needed.   Yes Historical Provider, MD  amLODipine (NORVASC) 2.5 MG tablet Take 2.5 mg by mouth daily.   Yes Historical Provider, MD  aspirin 325 MG EC tablet Take 325 mg by mouth daily.   Yes Historical Provider, MD  cholecalciferol (VITAMIN D) 400 UNITS TABS tablet Take 50,000 Units by mouth every 30 (thirty) days.   Yes Historical Provider, MD  digoxin (LANOXIN) 0.125 MG tablet Take 0.125 mg by mouth daily.   Yes Historical Provider, MD  donepezil (ARICEPT) 10 MG tablet Take 10 mg by mouth at bedtime.   Yes Historical Provider, MD  ferrous sulfate 325 (65 FE) MG tablet Take 325 mg by mouth 2 (two) times daily with a meal.   Yes Historical Provider, MD  furosemide (LASIX) 20 MG tablet Take 10 mg by mouth.   Yes Historical Provider, MD  memantine (NAMENDA XR) 28 MG CP24 24 hr capsule Take 28 mg by mouth.   Yes Historical Provider, MD  nystatin (MYCOSTATIN/NYSTOP) 100000 UNIT/GM POWD Apply topically 2 (two) times daily.   Yes Historical Provider, MD  omega-3 acid ethyl esters (LOVAZA) 1 G capsule Take 1 g by mouth daily.   Yes Historical Provider, MD  potassium chloride (K-DUR,KLOR-CON) 10 MEQ tablet Take 10 mEq by mouth at bedtime.   Yes Historical Provider, MD  vitamin E 400 UNIT capsule Take 400 Units by mouth daily.   Yes Historical Provider, MD    REVIEW OF SYSTEMS:  Review of Systems  Unable to perform ROS: dementia     VITAL SIGNS:   Filed Vitals:   11/26/14 2201 11/26/14 2230 11/26/14 2300 11/26/14 2330  BP: 127/56 107/55 100/51 149/58  Pulse: 61 58  39  Temp:      TempSrc:      Resp: Weight:      SpO2: 98% 98% 97% 100%   Wt Readings from Last 3 Encounters:  11/26/14 54.8 kg (120 lb 13 oz)    PHYSICAL EXAMINATION:  Physical Exam  Vitals  reviewed. Constitutional: She appears well-developed and well-nourished. No distress.  HENT:  Head: Normocephalic and atraumatic.  Mouth/Throat: Oropharynx is clear and moist.  Eyes: Conjunctivae are normal. Pupils are equal, round, and reactive to light. No scleral icterus.  Neck: Normal range of motion. Neck supple. No JVD present. No thyromegaly present.  Cardiovascular: Normal rate and intact distal pulses.  Exam reveals no gallop and no friction rub.   No murmur heard. Irregular  Respiratory: Effort normal and breath sounds normal. No respiratory distress. She has no wheezes. She has no rales.  GI: Soft. Bowel sounds are normal. She exhibits no distension. There is no tenderness.  Musculoskeletal: Normal range of motion. She exhibits no edema.  No arthritis, no gout  Lymphadenopathy:    She has no cervical adenopathy.  Neurological:  Unable to fully assess, patient does not follow commands to dementia  Skin: Skin is warm and dry. No rash noted. No erythema.  Psychiatric:  Unable to assess due to dementia    LABORATORY PANEL:   CBC  Recent Labs Lab 11/26/14 2125  WBC 10.7  HGB 12.6  HCT 39.2  PLT 194   ------------------------------------------------------------------------------------------------------------------  Chemistries   Recent Labs Lab 11/26/14 2125  NA 140  K 4.0  CL 106  CO2 28  GLUCOSE 125*  BUN 20  CREATININE 0.73  CALCIUM 9.1   ------------------------------------------------------------------------------------------------------------------  Cardiac Enzymes No results for input(s): TROPONINI in the last 168 hours. ------------------------------------------------------------------------------------------------------------------  RADIOLOGY:  No results found.  EKG:   Orders placed or performed during the hospital encounter of 11/26/14  . EKG 12-Lead  . EKG 12-Lead  . ED EKG  . ED EKG    IMPRESSION AND PLAN:  Principal Problem:    GI bleed - with multiple bouts of melena. Hemoglobin stable at this time, however we will trend closely. Patient does not endorse any pain, though she is severely demented. However, vital signs are stable at this time. We will initiate a Protonix drip, get a GI consult, and keep her on gentle fluids. Active Problems:   HTN (hypertension) - currently controlled, continue home medications   A-fib - continue home rate controlling medicines and digoxin   Alzheimer's dementia - continue home meds, donepezil and Namenda   GERD (gastroesophageal reflux disease) - PPI drip as above for now, then home dose PPI   HLD (hyperlipidemia) - continue home meds  All the records are reviewed and case discussed with ED provider. Management plans discussed with the patient and/or family.  DVT PROPHYLAXIS: mechanical only  ADMISSION STATUS:  Inpatient  CODE STATUS: DO NOT RESUSCITATE  TOTAL TIME TAKING CARE OF THIS PATIENT: 40 minutes.    WILLIS, DAVID FIELDING 11/26/2014, 11:47 PM  [facility]  Office  [phone]  CC: Primary care physician; No primary care provider on file.

## 2014-11-27 ENCOUNTER — Encounter: Payer: Self-pay | Admitting: Gastroenterology

## 2014-11-27 LAB — CBC
HEMATOCRIT: 33.6 % — AB (ref 35.0–47.0)
Hemoglobin: 10.9 g/dL — ABNORMAL LOW (ref 12.0–16.0)
MCH: 28.7 pg (ref 26.0–34.0)
MCHC: 32.4 g/dL (ref 32.0–36.0)
MCV: 88.7 fL (ref 80.0–100.0)
Platelets: 153 10*3/uL (ref 150–440)
RBC: 3.79 MIL/uL — AB (ref 3.80–5.20)
RDW: 14.4 % (ref 11.5–14.5)
WBC: 6.9 10*3/uL (ref 3.6–11.0)

## 2014-11-27 LAB — BASIC METABOLIC PANEL
ANION GAP: 2 — AB (ref 5–15)
BUN: 14 mg/dL (ref 6–20)
CHLORIDE: 117 mmol/L — AB (ref 101–111)
CO2: 26 mmol/L (ref 22–32)
Calcium: 7.3 mg/dL — ABNORMAL LOW (ref 8.9–10.3)
Creatinine, Ser: 0.49 mg/dL (ref 0.44–1.00)
GFR calc non Af Amer: >60 mL/min (ref >60)
Glucose, Bld: 72 mg/dL (ref 65–99)
POTASSIUM: 3 mmol/L — AB (ref 3.5–5.1)
SODIUM: 145 mmol/L (ref 135–145)

## 2014-11-27 LAB — MAGNESIUM: Magnesium: 1.7 mg/dL (ref 1.7–2.4)

## 2014-11-27 LAB — POTASSIUM: POTASSIUM: 4.5 mmol/L (ref 3.5–5.1)

## 2014-11-27 LAB — MRSA PCR SCREENING: MRSA by PCR: POSITIVE — AB

## 2014-11-27 MED ORDER — MEMANTINE HCL ER 14 MG PO CP24
28.0000 mg | ORAL_CAPSULE | Freq: Every day | ORAL | Status: DC
  Administered 2014-11-27 – 2014-11-28 (×2): 28 mg via ORAL
  Filled 2014-11-27: qty 4
  Filled 2014-11-27: qty 2

## 2014-11-27 MED ORDER — ACETAMINOPHEN 325 MG PO TABS: 650.0000 mg | ORAL_TABLET | Freq: Four times a day (QID) | ORAL | Status: DC | PRN

## 2014-11-27 MED ORDER — PANTOPRAZOLE SODIUM 40 MG IV SOLR: 40.0000 mg | Freq: Two times a day (BID) | INTRAVENOUS | Status: DC

## 2014-11-27 MED ORDER — ONDANSETRON HCL 4 MG/2ML IJ SOLN: 4.0000 mg | Freq: Four times a day (QID) | INTRAMUSCULAR | Status: DC | PRN

## 2014-11-27 MED ORDER — SODIUM CHLORIDE 0.9 % IV SOLN
8.0000 mg/h | INTRAVENOUS | Status: DC
  Filled 2014-11-27: qty 80

## 2014-11-27 MED ORDER — POTASSIUM CHLORIDE 10 MEQ/100ML IV SOLN
10.0000 meq | INTRAVENOUS | Status: AC
  Administered 2014-11-27 (×4): 10 meq via INTRAVENOUS
  Filled 2014-11-27 (×6): qty 100

## 2014-11-27 MED ORDER — ASPIRIN EC 325 MG PO TBEC
325.0000 mg | DELAYED_RELEASE_TABLET | Freq: Every day | ORAL | Status: DC
  Administered 2014-11-27: 325 mg via ORAL
  Filled 2014-11-27: qty 1

## 2014-11-27 MED ORDER — SODIUM CHLORIDE 0.9 % IV SOLN
INTRAVENOUS | Status: AC
  Administered 2014-11-27: 03:00:00 via INTRAVENOUS

## 2014-11-27 MED ORDER — OMEGA-3-ACID ETHYL ESTERS 1 G PO CAPS
1.0000 g | ORAL_CAPSULE | Freq: Every day | ORAL | Status: DC
  Administered 2014-11-27: 1 g via ORAL
  Filled 2014-11-27 (×2): qty 1

## 2014-11-27 MED ORDER — ACETAMINOPHEN 650 MG RE SUPP: 650.0000 mg | Freq: Four times a day (QID) | RECTAL | Status: DC | PRN

## 2014-11-27 MED ORDER — SODIUM CHLORIDE 0.9 % IV SOLN: INTRAVENOUS | Status: DC

## 2014-11-27 MED ORDER — POTASSIUM CHLORIDE CRYS ER 20 MEQ PO TBCR
40.0000 meq | EXTENDED_RELEASE_TABLET | ORAL | Status: DC
  Filled 2014-11-27: qty 2

## 2014-11-27 MED ORDER — CHLORHEXIDINE GLUCONATE CLOTH 2 % EX PADS: 6.0000 | MEDICATED_PAD | Freq: Every day | CUTANEOUS | Status: DC

## 2014-11-27 MED ORDER — MUPIROCIN 2 % EX OINT
1.0000 "application " | TOPICAL_OINTMENT | Freq: Two times a day (BID) | CUTANEOUS | Status: DC
  Administered 2014-11-27 – 2014-11-28 (×2): 1 via NASAL
  Filled 2014-11-27 (×2): qty 22

## 2014-11-27 MED ORDER — SODIUM CHLORIDE 0.9 % IV SOLN
80.0000 mg | Freq: Once | INTRAVENOUS | Status: AC
  Administered 2014-11-27: 80 mg via INTRAVENOUS
  Filled 2014-11-27: qty 80

## 2014-11-27 MED ORDER — AMLODIPINE BESYLATE 5 MG PO TABS
2.5000 mg | ORAL_TABLET | Freq: Every day | ORAL | Status: DC
  Administered 2014-11-27 – 2014-11-28 (×2): 2.5 mg via ORAL
  Filled 2014-11-27 (×2): qty 1

## 2014-11-27 MED ORDER — ONDANSETRON HCL 4 MG PO TABS: 4.0000 mg | ORAL_TABLET | Freq: Four times a day (QID) | ORAL | Status: DC | PRN

## 2014-11-27 MED ORDER — DIGOXIN 125 MCG PO TABS
0.1250 mg | ORAL_TABLET | Freq: Every day | ORAL | Status: DC
Start: 2014-11-27 — End: 2014-11-28
  Administered 2014-11-27: 0.125 mg via ORAL
  Filled 2014-11-27 (×3): qty 1

## 2014-11-27 MED ORDER — SODIUM CHLORIDE 0.9 % IJ SOLN
3.0000 mL | Freq: Two times a day (BID) | INTRAMUSCULAR | Status: DC
  Administered 2014-11-27: 3 mL via INTRAVENOUS

## 2014-11-27 MED ORDER — DONEPEZIL HCL 5 MG PO TABS
10.0000 mg | ORAL_TABLET | Freq: Every day | ORAL | Status: DC
  Administered 2014-11-27: 10 mg via ORAL
  Filled 2014-11-27: qty 2

## 2014-11-27 NOTE — Progress Notes (Signed)
CONSULT NOTE - INITIAL   Pharmacy Consult for Electrolyte Replacement    Allergies  Allergen Reactions  . Meperidine And Related   . Streptomycin   . Sulfa Antibiotics   . Toprol Xl [Metoprolol Tartrate]     Patient Measurements: Weight: 115 lb 4.8 oz (52.3 kg)   Vital Signs: Temp: 97.8 F (36.6 C) (09/21 2001) Temp Source: Oral (09/21 2001) BP: 148/61 mmHg (09/21 2001) Pulse Rate: 66 (09/21 2001) Intake/Output from previous day: 09/20 0701 - 09/21 0700 In: 3 [I.V.:3] Out: -  Intake/Output from this shift:    Labs:  Recent Labs  11/26/14 2125 11/26/14 2237 11/27/14 0438  WBC 10.7  --  6.9  HGB 12.6  --  10.9*  HCT 39.2  --  33.6*  PLT 194  --  153  APTT  --  27  --   CREATININE 0.73  --  0.49  MG  --   --  1.7   CrCl cannot be calculated (Unknown ideal weight.).   Microbiology: Recent Results (from the past 720 hour(s))  MRSA PCR Screening     Status: Abnormal   Collection Time: 11/27/14  1:36 AM  Result Value Ref Range Status   MRSA by PCR POSITIVE (A) NEGATIVE Final    Comment:        The GeneXpert MRSA Assay (FDA approved for NASAL specimens only), is one component of a comprehensive MRSA colonization surveillance program. It is not intended to diagnose MRSA infection nor to guide or monitor treatment for MRSA infections. CRITICAL RESULT CALLED TO, READ BACK BY AND VERIFIED WITH: TERESA COPAL 11/27/2014 0353 [facility]     Medical History: Past Medical History  Diagnosis Date  . Atrial fibrillation   . Heart failure   . Hypertension   . Iron deficiency anemia   . Alzheimer disease   . Meniere disease   . Hiatal hernia   . HLD (hyperlipidemia)   . GERD (gastroesophageal reflux disease)     Medications:  Scheduled:  . amLODipine  2.5 mg Oral Daily  . Chlorhexidine Gluconate Cloth  6 each Topical Q0600  . digoxin  0.125 mg Oral Daily  . donepezil  10 mg Oral QHS  . memantine  28 mg Oral Daily  . mupirocin ointment  1 application Nasal  BID  . omega-3 acid ethyl esters  1 g Oral Daily  . [START ON 11/30/2014] pantoprazole (PROTONIX) IV  40 mg Intravenous Q12H  . sodium chloride  3 mL Intravenous Q12H   Infusions:  . sodium chloride    . pantoprozole (PROTONIX) infusion      Assessment: Pharmacy consulted to replacement electrolytes (potassium and magnesium) in an 81 yo female admitted for GI bleed.     K: 3.0, Mag: 1.7  Original orders for potassium 40 meq po X 2.  Per RN, patient unable to swallow tablets and may have received about 1/2 of a dose.    Goal of Therapy:  Normalize potassium and magnesium  Plan:  Magnesium level within normal limits.  No supplementation required.   Will order KCl 10 meq IV x 6 for total daily dose of ~80 meq (unsure of amount initially ingested) today.  Will recheck potassium level at 2000 to assess  further supplementation needs.  9/21:  K = 4.5 .   Will not order additional KCl supplementation.   Will recheck electrolytes on 9/22 with AM labs.   Pharmacy will continue to follow.  Robbins,Jason D 11/27/2014,9:26 PM

## 2014-11-27 NOTE — Clinical Social Work Note (Signed)
Clinical Social Work Assessment  Patient Details  Name: Abigail Dougherty MRN: [id number] Date of Birth: February 05, 1928  Date of referral:  11/27/14               Reason for consult:  Facility Placement                Permission sought to share information with:    Permission granted to share information::  Yes, Verbal Permission Granted  Name::     Marcelle Overlie   Agency::     Relationship::  guardian  Solicitor Information:  [phone] (office), [phone] (work cell)  Housing/Transportation Living arrangements for the past 2 months:  Skilled Building surveyor of Information:  Facility, Guardian Patient Interpreter Needed:  None Criminal Activity/Legal Involvement Pertinent to Current Situation/Hospitalization:  No - Comment as needed Significant Relationships:  None Lives with:  Facility Resident Do you feel safe going back to the place where you live?  Yes Need for family participation in patient care:  No (Coment)  Care giving concerns:  Pt is confused.   Social Worker assessment / plan:  CSW spoke to staff at [facility] and legal guardian Saintclair Halsted, DSS Social Worker III by phone.  Pt has been a ward of the stated since September 2015 and has been a resident a [facility] since August 2015.  Pt has a son who does not have much contact with pt.  Pt is able to return to the facility when medically stable.  CSW completed an FL2 and placed it on the chart for signature.  CSW will continue to follow and assist with d/c planning needs.  Employment status:  Retired Database administrator PT Recommendations:  Not assessed at this time Information / Referral to community resources:  Skilled Nursing Facility  Patient/Family's Response to care:  Facility and guardian were pleasant.  Patient/Family's Understanding of and Emotional Response to Diagnosis, Current Treatment, and Prognosis: Pt will return to [facility] upon discharge.  Emotional  Assessment Appearance:  Appears stated age Attitude/Demeanor/Rapport:  Other (Cooperative) Affect (typically observed):  Pleasant Orientation:  Oriented to Self Alcohol / Substance use:  Not Applicable Psych involvement (Current and /or in the community):  No (Comment)  Discharge Needs  Concerns to be addressed:  Discharge Planning Concerns Readmission within the last 30 days:  No Current discharge risk:  None Barriers to Discharge:  Continued Medical Work up   [place], Fr[place]ley D, LCSW 11/27/2014, 11:39 AM

## 2014-11-27 NOTE — Progress Notes (Signed)
Called Abigail Dougherty in pharmacy and advised her that patient is unable to swallow the potassium, even split. She began chewing the pill.  She is changing order to IV potassium.

## 2014-11-27 NOTE — Progress Notes (Signed)
CONSULT NOTE - INITIAL   Pharmacy Consult for Electrolyte Replacement    Allergies  Allergen Reactions  . Meperidine And Related   . Streptomycin   . Sulfa Antibiotics   . Toprol Xl [Metoprolol Tartrate]     Patient Measurements: Weight: 115 lb 4.8 oz (52.3 kg)   Vital Signs: Temp: 96.8 F (36 C) (09/21 1131) Temp Source: Oral (09/21 1131) BP: 144/64 mmHg (09/21 1131) Pulse Rate: 71 (09/21 1131) Intake/Output from previous day: 09/20 0701 - 09/21 0700 In: 3 [I.V.:3] Out: -  Intake/Output from this shift:    Labs:  Recent Labs  11/26/14 2125 11/26/14 2237 11/27/14 0438  WBC 10.7  --  6.9  HGB 12.6  --  10.9*  HCT 39.2  --  33.6*  PLT 194  --  153  APTT  --  27  --   CREATININE 0.73  --  0.49  MG  --   --  1.7   CrCl cannot be calculated (Unknown ideal weight.).   Microbiology: Recent Results (from the past 720 hour(s))  MRSA PCR Screening     Status: Abnormal   Collection Time: 11/27/14  1:36 AM  Result Value Ref Range Status   MRSA by PCR POSITIVE (A) NEGATIVE Final    Comment:        The GeneXpert MRSA Assay (FDA approved for NASAL specimens only), is one component of a comprehensive MRSA colonization surveillance program. It is not intended to diagnose MRSA infection nor to guide or monitor treatment for MRSA infections. CRITICAL RESULT CALLED TO, READ BACK BY AND VERIFIED WITH: TERESA COPAL 11/27/2014 0353 [facility]     Medical History: Past Medical History  Diagnosis Date  . Atrial fibrillation   . Heart failure   . Hypertension   . Iron deficiency anemia   . Alzheimer disease   . Meniere disease   . Hiatal hernia   . HLD (hyperlipidemia)   . GERD (gastroesophageal reflux disease)     Medications:  Scheduled:  . amLODipine  2.5 mg Oral Daily  . Chlorhexidine Gluconate Cloth  6 each Topical Q0600  . digoxin  0.125 mg Oral Daily  . donepezil  10 mg Oral QHS  . memantine  28 mg Oral Daily  . mupirocin ointment  1 application Nasal  BID  . omega-3 acid ethyl esters  1 g Oral Daily  . [START ON 11/30/2014] pantoprazole (PROTONIX) IV  40 mg Intravenous Q12H  . potassium chloride  10 mEq Intravenous Q1 Hr x 6  . sodium chloride  3 mL Intravenous Q12H   Infusions:  . sodium chloride 75 mL/hr at 11/27/14 0251  . pantoprozole (PROTONIX) infusion      Assessment: Pharmacy consulted to replacement electrolytes (potassium and magnesium) in an 81 yo female admitted for GI bleed.     K: 3.0, Mag: 1.7  Original orders for potassium 40 meq po X 2.  Per RN, patient unable to swallow tablets and may have received about 1/2 of a dose.    Goal of Therapy:  Normalize potassium and magnesium  Plan:  Magnesium level within normal limits.  No supplementation required.   Will order KCl 10 meq IV x 6 for total daily dose of ~80 meq (unsure of amount initially ingested) today.  Will recheck potassium level at 2000 to assess  further supplementation needs.  Pharmacy will continue to follow.  Scarpena,Crystal G 11/27/2014,12:09 PM

## 2014-11-27 NOTE — Progress Notes (Signed)
Per Dr. Allena Katz: call Crystal in pharmacy & ask her to replace patient's potassium. Crystal said she would take care of the order.

## 2014-11-27 NOTE — Consult Note (Signed)
GI Inpatient Consult Note  Reason for Consult: GI Bleed   Attending Requesting Consult: Dr. Eliane Decree  History of Present Illness: Abigail Dougherty is a 81 y.o. female with Dementia has been admitted for further evaluation of a GI Bleed. She was transported to the ER from residential care with a history of melanotic stools on 11/26/14. Patient had a melanotic stool while being evaluated in the ER. Her hemoglobin was 12.6  in the ER with stable vital signs. Today's hemoglobin is 10.9. No further Melanotic stools.   Past Medical History:  Past Medical History  Diagnosis Date  . Atrial fibrillation   . Heart failure   . Hypertension   . Iron deficiency anemia   . Alzheimer disease   . Meniere disease   . Hiatal hernia   . HLD (hyperlipidemia)   . GERD (gastroesophageal reflux disease)     Problem List: Patient Active Problem List   Diagnosis Date Noted  . GI bleed 11/26/2014  . GERD (gastroesophageal reflux disease) 11/26/2014  . HTN (hypertension) 11/26/2014  . A-fib 11/26/2014  . Alzheimer's dementia 11/26/2014  . HLD (hyperlipidemia) 11/26/2014    Past Surgical History: Past Surgical History  Procedure Laterality Date  . Mastectomy      Allergies: Allergies  Allergen Reactions  . Meperidine And Related   . Streptomycin   . Sulfa Antibiotics   . Toprol Xl [Metoprolol Tartrate]     Home Medications: Prescriptions prior to admission  Medication Sig Dispense Refill Last Dose  . acetaminophen (TYLENOL) 500 MG tablet Take 500 mg by mouth every 6 (six) hours as needed.   PRN at PRN  . amLODipine (NORVASC) 2.5 MG tablet Take 2.5 mg by mouth daily.   11/26/2014 at unknown  . aspirin 325 MG EC tablet Take 325 mg by mouth daily.   11/26/2014 at unknown  . cholecalciferol (VITAMIN D) 400 UNITS TABS tablet Take 50,000 Units by mouth every 30 (thirty) days.   11/26/2014 at Unknown time  . digoxin (LANOXIN) 0.125 MG tablet Take 0.125 mg by mouth daily.   11/26/2014 at unknown  .  donepezil (ARICEPT) 10 MG tablet Take 10 mg by mouth at bedtime.   11/26/2014 at unknown  . ferrous sulfate 325 (65 FE) MG tablet Take 325 mg by mouth 2 (two) times daily with a meal.   11/26/2014 at unknown  . furosemide (LASIX) 20 MG tablet Take 10 mg by mouth.   11/26/2014 at unknown  . memantine (NAMENDA XR) 28 MG CP24 24 hr capsule Take 28 mg by mouth.   11/26/2014 at unknown  . nystatin (MYCOSTATIN/NYSTOP) 100000 UNIT/GM POWD Apply topically 2 (two) times daily.   unknown at unknown  . omega-3 acid ethyl esters (LOVAZA) 1 G capsule Take 1 g by mouth daily.   11/26/2014 at unknown  . potassium chloride (K-DUR,KLOR-CON) 10 MEQ tablet Take 10 mEq by mouth at bedtime.   11/26/2014 at unknown  . vitamin E 400 UNIT capsule Take 400 Units by mouth daily.   11/26/2014 at unknown   Home medication reconciliation was completed with the patient.   Scheduled Inpatient Medications:   . amLODipine  2.5 mg Oral Daily  . Chlorhexidine Gluconate Cloth  6 each Topical Q0600  . digoxin  0.125 mg Oral Daily  . donepezil  10 mg Oral QHS  . memantine  28 mg Oral Daily  . mupirocin ointment  1 application Nasal BID  . omega-3 acid ethyl esters  1 g Oral Daily  . [  START ON 11/30/2014] pantoprazole (PROTONIX) IV  40 mg Intravenous Q12H  . potassium chloride  10 mEq Intravenous Q1 Hr x 6  . sodium chloride  3 mL Intravenous Q12H    Continuous Inpatient Infusions:   . pantoprozole (PROTONIX) infusion      PRN Inpatient Medications:  acetaminophen **OR** acetaminophen, ondansetron **OR** ondansetron (ZOFRAN) IV  Family History: family history includes Aortic aneurysm in her mother; Breast cancer in her mother; Cancer in her father; Hypertension in her father and mother; Stroke in her mother.  The patient's family history is negative for inflammatory bowel disorders, GI malignancy, or solid organ transplantation.  Social History:  Former smoker and no alcohol intake per review of chart. Unable to ask patient  due to her dementia.   Review of Systems: Unable to completely assess given pt's dementia.  GI: per her nurse today. No BRBPR. Stools are green.    Physical Examination: BP 144/64 mmHg  Pulse 71  Temp(Src) 96.8 F (36 C) (Oral)  Resp 18  Wt 52.3 kg (115 lb 4.8 oz)  SpO2 98% Gen: NAD, alert. Dementia. Thin. HEENT: EOMI. Sclera aniteric. Neck: supple, no JVD or thyromegaly Chest: CTA bilaterally, no wheezes, crackles, or other adventitious sounds CV: RRR, no m/g/c/r Abd: soft, NT, ND, +BS in all four quadrants; no HSM, guarding, ridigity, or rebound tenderness Ext: no edema, well perfused with 2+ pulses,   Data: Lab Results  Component Value Date   WBC 6.9 11/27/2014   HGB 10.9* 11/27/2014   HCT 33.6* 11/27/2014   MCV 88.7 11/27/2014   PLT 153 11/27/2014    Recent Labs Lab 11/26/14 2125 11/27/14 0438  HGB 12.6 10.9*   Lab Results  Component Value Date   NA 145 11/27/2014   K 3.0* 11/27/2014   CL 117* 11/27/2014   CO2 26 11/27/2014   BUN 14 11/27/2014   CREATININE 0.49 11/27/2014   Lab Results  Component Value Date   ALT 18 11/08/2013   AST 20 11/08/2013   ALKPHOS 102 11/08/2013   BILITOT 0.7 11/08/2013    Recent Labs Lab 11/26/14 2237  APTT 27  INR 1.06   Assessment/Plan: [name] is a 81 y.o. female with a history of melena. Melanotic stool noted in the ER. Bowel movements have been green per nursing. No BRBPR noted. Hemoglobin 10.7. No apparent pain on exam. Appears comfortable. Possible Upper GI bleed secondary to gastritis. ASA 325 mg noted on outpatient medication list. GERD per history. Protonix Infusion has been ordered. History of IDA with FeSO4 supplements.  Recommendations:  Follow h/h closely. Occult stools. Continue with Protonix.  Further recommendations to follow.   Thank you for the consult. Please call with questions or concerns. Patient seen with Dr. Lutricia Feil under collaborative agreement.  Daryll Drown, FNP

## 2014-11-27 NOTE — Care Management (Signed)
Attempted to meet with patient before finding out that she was from [facility] but she was sleeping. RN states patient is somewhat confused. Larita Fife CSW notified; CSW consult placed.

## 2014-11-27 NOTE — ED Notes (Signed)
Pt incont of large amount soft/loose brownish stool; pt clensed and attends changed

## 2014-11-27 NOTE — Consult Note (Signed)
  Pt seen and examined. Please see D. Martin's notes. Pleasant but confused. Essentially denied any GI sxs to me. Hx of melena at nursing home. Some drop in hgb. Started protonix. On ASA at [facility]. Please hold ASA. Continue to moniter hgb. If hgb continues to fall with stool being positive for blood, then will need to get consent from POA later for EGD. Will follow. Thanks.

## 2014-11-27 NOTE — Progress Notes (Signed)
[facility] - [place] at [facility]   PATIENT NAME: Zianna Dercole    MR#:  [id number]  DATE OF BIRTH:  [birth year]  SUBJECTIVE:  Denies any diarrhea or abd pain  REVIEW OF SYSTEMS:   Review of Systems  Constitutional: Negative for fever, chills and weight loss.  HENT: Negative for ear discharge, ear pain and nosebleeds.   Eyes: Negative for blurred vision, pain and discharge.  Respiratory: Negative for sputum production, shortness of breath, wheezing and stridor.   Cardiovascular: Negative for chest pain, palpitations, orthopnea and PND.  Gastrointestinal: Positive for melena. Negative for nausea, vomiting, abdominal pain and diarrhea.  Genitourinary: Negative for urgency and frequency.  Musculoskeletal: Negative for back pain and joint pain.  Neurological: Positive for weakness. Negative for sensory change, speech change and focal weakness.  Psychiatric/Behavioral: Negative for depression. The patient is not nervous/anxious.   All other systems reviewed and are negative.  Tolerating Diet:yes   DRUG ALLERGIES:   Allergies  Allergen Reactions  . Meperidine And Related   . Streptomycin   . Sulfa Antibiotics   . Toprol Xl [Metoprolol Tartrate]     VITALS:  Blood pressure 144/64, pulse 71, temperature 96.8 F (36 C), temperature source Oral, resp. rate 18, weight 52.3 kg (115 lb 4.8 oz), SpO2 98 %.  PHYSICAL EXAMINATION:   Physical Exam  GENERAL:  81 y.o.-year-old patient lying in the bed with no acute distress.  EYES: Pupils equal, round, reactive to light and accommodation. No scleral icterus. Extraocular muscles intact.  HEENT: Head atraumatic, normocephalic. Oropharynx and nasopharynx clear.  NECK:  Supple, no jugular venous distention. No thyroid enlargement, no tenderness.  LUNGS: Normal breath sounds bilaterally, no wheezing, rales, rhonchi. No use of accessory muscles of respiration.  CARDIOVASCULAR: S1, S2 normal. No murmurs, rubs, or  gallops.  ABDOMEN: Soft, nontender, nondistended. Bowel sounds present. No organomegaly or mass.  EXTREMITIES: No cyanosis, clubbing or edema b/l.    NEUROLOGIC: Cranial nerves II through XII are intact. No focal Motor or sensory deficits b/l.   PSYCHIATRIC: The patient is alert and oriented x 2  SKIN: No obvious rash, lesion, or ulcer.    LABORATORY PANEL:   CBC  Recent Labs Lab 11/27/14 0438  WBC 6.9  HGB 10.9*  HCT 33.6*  PLT 153    Chemistries   Recent Labs Lab 11/27/14 0438  NA 145  K 3.0*  CL 117*  CO2 26  GLUCOSE 72  BUN 14  CREATININE 0.49  CALCIUM 7.3*  MG 1.7    Cardiac Enzymes No results for input(s): TROPONINI in the last 168 hours.  RADIOLOGY:  No results found.   ASSESSMENT AND PLAN:  81 y.o. female who presents with melena. Patient resides at a nursing facility, and was found to have melena by staff there and was sent to the ED  *GI bleed - with multiple bouts of melena. Hemoglobin stable at this time, however we will trend closely. Patient does not endorse any pain, though she is severely demented. -IV PPI -GI consult pending  * HTN (hypertension) - currently controlled, continue home medications  * A-fib - continue home rate controlling medicines and digoxin  * Alzheimer's dementia - continue home meds, donepezil and Namenda  * GERD (gastroesophageal reflux disease) - PPI drip as above for now, then home dose PPI  * HLD (hyperlipidemia) - continue home meds  Case discussed with Care Management/Social Worker.   CODE STATUS: dnr  DVT Prophylaxis: scd/teds  TOTAL TIME TAKING  CARE OF THIS PATIENT: .  >50% time spent on counselling and coordination of care pt and CSW  POSSIBLE D/C IN 1-2 DAYS, DEPENDING ON CLINICAL CONDITION.   PATEL,SONA M.D on 11/27/2014 at 2:22 PM  Between 7am to 6pm - Pager - [phone]  After 6pm go to [email or web address] - password [email or web address]  [place] [facility]  Office   [phone]  CC: Primary care physician; No primary care provider on file.

## 2014-11-28 DIAGNOSIS — L8990 Pressure ulcer of unspecified site, unspecified stage: Secondary | ICD-10-CM | POA: Insufficient documentation

## 2014-11-28 LAB — BASIC METABOLIC PANEL
Anion gap: 4 — ABNORMAL LOW (ref 5–15)
BUN: 12 mg/dL (ref 6–20)
CHLORIDE: 111 mmol/L (ref 101–111)
CO2: 28 mmol/L (ref 22–32)
CREATININE: 0.61 mg/dL (ref 0.44–1.00)
Calcium: 8.7 mg/dL — ABNORMAL LOW (ref 8.9–10.3)
GFR calc Af Amer: >60 mL/min (ref >60)
GFR calc non Af Amer: >60 mL/min (ref >60)
GLUCOSE: 82 mg/dL (ref 65–99)
POTASSIUM: 3.9 mmol/L (ref 3.5–5.1)
SODIUM: 143 mmol/L (ref 135–145)

## 2014-11-28 LAB — DIGOXIN LEVEL: Digoxin Level: 0.6 ng/mL — ABNORMAL LOW (ref 0.8–2.0)

## 2014-11-28 LAB — MAGNESIUM: Magnesium: 2 mg/dL (ref 1.7–2.4)

## 2014-11-28 MED ORDER — PANTOPRAZOLE SODIUM 40 MG PO TBEC: 40.0000 mg | DELAYED_RELEASE_TABLET | Freq: Every day | ORAL | Status: AC

## 2014-11-28 NOTE — Clinical Social Work Note (Signed)
Pt is ready for d/c today.  CSW informed legal guardian and staff at [facility].  CSW sent d/c documents to the facility.  Pt will be transported via EMS.  CSW provided facility information to RN in order to call report.  CSW signing off as there are no further needs at this time.  [place], [place] [phone]

## 2014-11-28 NOTE — Care Management Important Message (Signed)
Important Message  Patient Details  Name: Abigail Dougherty MRN: [id number] Date of Birth: 15-May-1927   Medicare Important Message Given:  Yes-second notification given    Collie Siad, RN 11/28/2014, 9:43 AM

## 2014-11-28 NOTE — Progress Notes (Signed)
CONSULT NOTE - INITIAL   Pharmacy Consult for Electrolyte Replacement    Allergies  Allergen Reactions  . Meperidine And Related   . Streptomycin   . Sulfa Antibiotics   . Toprol Xl [Metoprolol Tartrate]     Patient Measurements: Weight: 115 lb 4.8 oz (52.3 kg)   Vital Signs: Temp: 97.9 F (36.6 C) (09/22 0405) Temp Source: Oral (09/22 0405) BP: 150/48 mmHg (09/22 0405) Pulse Rate: 52 (09/22 0405) Intake/Output from previous day: 09/21 0701 - 09/22 0700 In: 1262.8 [I.V.:1262.8] Out: 0  Intake/Output from this shift:    Labs:  Recent Labs  11/26/14 2125 11/26/14 2237 11/27/14 0438 11/28/14 0513  WBC 10.7  --  6.9  --   HGB 12.6  --  10.9*  --   HCT 39.2  --  33.6*  --   PLT 194  --  153  --   APTT  --  27  --   --   CREATININE 0.73  --  0.49 0.61  MG  --   --  1.7 2.0   CrCl cannot be calculated (Unknown ideal weight.).   Microbiology: Recent Results (from the past 720 hour(s))  MRSA PCR Screening     Status: Abnormal   Collection Time: 11/27/14  1:36 AM  Result Value Ref Range Status   MRSA by PCR POSITIVE (A) NEGATIVE Final    Comment:        The GeneXpert MRSA Assay (FDA approved for NASAL specimens only), is one component of a comprehensive MRSA colonization surveillance program. It is not intended to diagnose MRSA infection nor to guide or monitor treatment for MRSA infections. CRITICAL RESULT CALLED TO, READ BACK BY AND VERIFIED WITH: TERESA COPAL 11/27/2014 0353 [facility]     Medical History: Past Medical History  Diagnosis Date  . Atrial fibrillation   . Heart failure   . Hypertension   . Iron deficiency anemia   . Alzheimer disease   . Meniere disease   . Hiatal hernia   . HLD (hyperlipidemia)   . GERD (gastroesophageal reflux disease)     Medications:  Scheduled:  . amLODipine  2.5 mg Oral Daily  . Chlorhexidine Gluconate Cloth  6 each Topical Q0600  . digoxin  0.125 mg Oral Daily  . donepezil  10 mg Oral QHS  . memantine   28 mg Oral Daily  . mupirocin ointment  1 application Nasal BID  . omega-3 acid ethyl esters  1 g Oral Daily  . [START ON 11/30/2014] pantoprazole (PROTONIX) IV  40 mg Intravenous Q12H  . sodium chloride  3 mL Intravenous Q12H   Infusions:  . sodium chloride    . pantoprozole (PROTONIX) infusion      Assessment: Pharmacy consulted to replacement electrolytes (potassium and magnesium) in an 81 yo female admitted for GI bleed.     K: 3.0, Mag: 1.7  Original orders for potassium 40 meq po X 2.  Per RN, patient unable to swallow tablets and may have received about 1/2 of a dose.    Goal of Therapy:  Normalize potassium and magnesium  Plan:  Magnesium level within normal limits.  No supplementation required.   Will order KCl 10 meq IV x 6 for total daily dose of ~80 meq (unsure of amount initially ingested) today.  Will recheck potassium level at 2000 to assess  further supplementation needs.  9/21:  K = 4.5 .   Will not order additional KCl supplementation.   Will recheck electrolytes on  9/22 with AM labs.   9/22 AM electrolytes WNL. Recheck in AM.  Pharmacy will continue to follow.  McBane,Matthew S 11/28/2014,5:57 AM

## 2014-11-28 NOTE — Progress Notes (Signed)
Pts HR 50, MD Konidena notified. Morning dose of Digoxin not given. Digoxin level drawn. MD Luberta Mutter notified of 0.6 level. Will continue to monitor.

## 2014-11-28 NOTE — Progress Notes (Signed)
Report called to Danford Bad at [facility], EMS called waiting on transfer.

## 2014-11-28 NOTE — Progress Notes (Signed)
Discharge Note:  Pts VSS, Pt transferred by EMS.

## 2014-11-28 NOTE — Discharge Summary (Signed)
Abigail Dougherty, is a 81 y.o. female  DOB May 11, 1927  MRN [id number].  Admission date:  11/26/2014  Admitting Physician  Oralia Manis, MD  Discharge Date:  11/28/2014   Primary MD  No primary care provider on file.  Recommendations for primary care physician for things to follow:   Follow-up With primary doctor in 1 week.   Admission Diagnosis  Gastrointestinal hemorrhage with melena [K92.1]   Discharge Diagnosis  Gastrointestinal hemorrhage with melena [K92.1]    Principal Problem:   GI bleed Active Problems:   GERD (gastroesophageal reflux disease)   HTN (hypertension)   A-fib   Alzheimer's dementia   HLD (hyperlipidemia)   Pressure ulcer      Past Medical History  Diagnosis Date  . Atrial fibrillation   . Heart failure   . Hypertension   . Iron deficiency anemia   . Alzheimer disease   . Meniere disease   . Hiatal hernia   . HLD (hyperlipidemia)   . GERD (gastroesophageal reflux disease)     Past Surgical History  Procedure Laterality Date  . Mastectomy         History of present illness and  Hospital Course:     Kindly see H&P for history of present illness and admission details, please review complete Labs, Consult reports and Test reports for all details in brief  HPI  from the history and physical done on the day of admission 81 year old female with history of dementia brought in from nursing facility secondary to bleeding. Itching is severe dementia and unable to contribute to the history.   Hospital Course   #1  malena  with possible upper GI bleed: Patient aspirin was stopped. Hemoglobin on admission was 12.6. Hematocrit 39.2. She'll hemoglobin was 10.9 on now September 21. Patient did not require any transfusions. Did not have any further episodes of Malena., she had green stools only.  Seen by gastroenterology Dr. Bluford Kaufmann. Did not have any luminal  evaluation secondary to no  further Malena.. #2 and hypokalemia resolved. #3 history of for chronic A. fib: Patient not a candidate for full anticoagulation secondary to dementia and history of falls and recent history of malena.. Heart rate has been running around 50 so we have held the digoxin. Patient can be restarted when HR 70-80. 4. Dementia Alzheimer's type continue  Donepezil  and Namenda. Code;DNR.  Discharge Condition: stable   Follow UP      Discharge Instructions  and  Discharge Medications   Follow up With primary doctor     Medication List    STOP taking these medications        aspirin 325 MG EC tablet      TAKE these medications        acetaminophen 500 MG tablet  Commonly known as:  TYLENOL  Take 500 mg by mouth every 6 (six) hours as needed.     amLODipine 2.5 MG tablet  Commonly known as:  NORVASC  Take 2.5 mg by mouth daily.     cholecalciferol 400 UNITS Tabs tablet  Commonly known as:  VITAMIN D  Take 50,000 Units by mouth every 30 (thirty) days.     digoxin 0.125 MG tablet  Commonly known as:  LANOXIN  Take 0.125 mg by mouth daily.     donepezil 10 MG tablet  Commonly known as:  ARICEPT  Take 10 mg by mouth at bedtime.     ferrous sulfate 325 (65 FE) MG tablet  Take 325  mg by mouth 2 (two) times daily with a meal.     furosemide 20 MG tablet  Commonly known as:  LASIX  Take 10 mg by mouth.     NAMENDA XR 28 MG Cp24 24 hr capsule  Generic drug:  memantine  Take 28 mg by mouth.     nystatin 100000 UNIT/GM Powd  Apply topically 2 (two) times daily.     omega-3 acid ethyl esters 1 G capsule  Commonly known as:  LOVAZA  Take 1 g by mouth daily.     pantoprazole 40 MG tablet  Commonly known as:  PROTONIX  Take 1 tablet (40 mg total) by mouth daily.     potassium chloride 10 MEQ tablet  Commonly known as:  K-DUR,KLOR-CON  Take 10 mEq by mouth at bedtime.     vitamin E  400 UNIT capsule  Take 400 Units by mouth daily.          Diet and Activity recommendation: See Discharge Instructions above   Consults obtained -none   Major procedures and Radiology Reports - PLEASE review detailed and final reports for all details, in brief -      No results found.  Micro Results    Recent Results (from the past 240 hour(s))  MRSA PCR Screening     Status: Abnormal   Collection Time: 11/27/14  1:36 AM  Result Value Ref Range Status   MRSA by PCR POSITIVE (A) NEGATIVE Final    Comment:        The GeneXpert MRSA Assay (FDA approved for NASAL specimens only), is one component of a comprehensive MRSA colonization surveillance program. It is not intended to diagnose MRSA infection nor to guide or monitor treatment for MRSA infections. CRITICAL RESULT CALLED TO, READ BACK BY AND VERIFIED WITH: TERESA COPAL 11/27/2014 0353 [facility]        Today   Subjective:   Abigail Dougherty today has no headache,no chest abdominal pain,no new weakness tingling or numbness, feels much better wants to go home today.   Objective:   Blood pressure 160/68, pulse 50, temperature 98.1 F (36.7 C), temperature source Oral, resp. rate 16, weight 52.3 kg (115 lb 4.8 oz), SpO2 97 %.   Intake/Output Summary (Last 24 hours) at 11/28/14 0954 Last data filed at 11/27/14 2002  Gross per 24 hour  Intake 1262.75 ml  Output      0 ml  Net 1262.75 ml    Exam Awake Alert, Oriented x 3, No new F.N deficits, Normal affect [place].AT,PERRAL Supple Neck,No JVD, No cervical lymphadenopathy appriciated.  Symmetrical Chest wall movement, Good air movement bilaterally, CTAB RRR,No Gallops,Rubs or new Murmurs, No Parasternal Heave +ve B.Sounds, Abd Soft, Non tender, No organomegaly appriciated, No rebound -guarding or rigidity. No Cyanosis, Clubbing or edema, No new Rash or bruise  Data Review   CBC w Diff: Lab Results  Component Value Date   WBC 6.9 11/27/2014   WBC 10.2  11/14/2013   HGB 10.9* 11/27/2014   HGB 11.9* 11/14/2013   HCT 33.6* 11/27/2014   HCT 37.5 11/14/2013   PLT 153 11/27/2014   PLT 133* 11/14/2013   LYMPHOPCT 10 11/26/2014   LYMPHOPCT 7.3 11/14/2013   MONOPCT 7 11/26/2014   MONOPCT 5.5 11/14/2013   EOSPCT 0 11/26/2014   EOSPCT 0.6 11/14/2013   BASOPCT 1 11/26/2014   BASOPCT 0.2 11/14/2013    CMP: Lab Results  Component Value Date   NA 143 11/28/2014   NA 140 11/15/2013  K 3.9 11/28/2014   K 3.3* 11/15/2013   CL 111 11/28/2014   CL 96* 11/15/2013   CO2 28 11/28/2014   CO2 39* 11/15/2013   BUN 12 11/28/2014   BUN 17 11/15/2013   CREATININE 0.61 11/28/2014   CREATININE 0.89 11/15/2013   PROT 5.2* 11/08/2013   ALBUMIN 1.9* 11/08/2013   BILITOT 0.7 11/08/2013   ALKPHOS 102 11/08/2013   AST 20 11/08/2013   ALT 18 11/08/2013  .   Total Time in preparing paper work, data evaluation and todays exam - 35 minutes  KONIDENA,SNEHALATHA M.D on 11/28/2014 at 9:54 AM

## 2015-04-28 ENCOUNTER — Other Ambulatory Visit: Payer: Self-pay | Admitting: Family Medicine

## 2015-04-28 DIAGNOSIS — R1310 Dysphagia, unspecified: Secondary | ICD-10-CM

## 2015-05-07 ENCOUNTER — Ambulatory Visit
Admission: RE | Admit: 2015-05-07 | Discharge: 2015-05-07 | Disposition: A | Discharge status: Home / Self Care | Payer: Medicare Other | Source: Ambulatory Visit | Attending: Family Medicine | Admitting: Family Medicine

## 2015-05-07 ENCOUNTER — Other Ambulatory Visit: Payer: Self-pay | Admitting: Family Medicine

## 2015-05-07 ENCOUNTER — Ambulatory Visit: Admission: RE | Admit: 2015-05-07 | Payer: Medicare Other | Source: Ambulatory Visit

## 2015-05-07 DIAGNOSIS — R1310 Dysphagia, unspecified: Secondary | ICD-10-CM | POA: Diagnosis not present

## 2015-05-07 DIAGNOSIS — R1312 Dysphagia, oropharyngeal phase: Secondary | ICD-10-CM

## 2015-05-07 NOTE — Therapy (Signed)
[facility] DIAGNOSTIC RADIOLOGY [address] [place], [place], [address] Phone: [phone]   Fax:     Modified Barium Swallow  Patient Details  Name: Abigail Dougherty MRN: [id number] Date of Birth: 08-30-1927 No Data Recorded  Encounter Date: 05/07/2015      End of Session - 05/07/15 1418    Visit Number 1   Number of Visits 1   Date for SLP Re-Evaluation 05/07/15   SLP Start Time 1230   SLP Stop Time  1330   SLP Time Calculation (min) 60 min   Activity Tolerance Patient tolerated treatment well      Past Medical History  Diagnosis Date  . Atrial fibrillation   . Heart failure   . Hypertension   . Iron deficiency anemia   . Alzheimer disease   . Meniere disease   . Hiatal hernia   . HLD (hyperlipidemia)   . GERD (gastroesophageal reflux disease)     Past Surgical History  Procedure Laterality Date  . Mastectomy      There were no vitals filed for this visit.  Visit Diagnosis: Oropharyngeal dysphagia  Dysphagia - Plan: DG OP Swallowing Func-Medicare/Speech Path, DG OP Swallowing Func-Medicare/Speech Path     Subjective: Patient behavior: (alertness, ability to follow instructions, etc.): Alert and oriented to swallowing.  Patient able to follow simple directions with cues.  Chief complaint: Patient is on a modified diet.   Objective:  Radiological Procedure: A videoflouroscopic evaluation of oral-preparatory, reflex initiation, and pharyngeal phases of the swallow was performed; as well as a screening of the upper esophageal phase.  I. POSTURE: Upright in MBS chair  II. VIEW: Lateral  III. COMPENSATORY STRATEGIES: Dry swallow partially clears pharyngeal residue.  IV. BOLUSES ADMINISTERED:   Thin Liquid: 2 teaspoon presentation.  2 self-administered cup rim sips   Nectar-thick Liquid: 5 rapid, consecutive swallows   Puree: 2 teaspoon presentations   Mechanical Soft: 1/4 graham cracker in  applesauce  V. RESULTS OF EVALUATION: A. ORAL PREPARATORY PHASE: (The lips, tongue, and velum are observed for strength and coordination)  patient is missing some teeth having an impact on efficiency of mastication       **Overall Severity Rating: Minimal  B. SWALLOW INITIATION/REFLEX: (The reflex is normal if "triggered" by the time the bolus reached the base of the tongue) triggers at the valleculae  **Overall Severity Rating: Minimal  C. PHARYNGEAL PHASE: (Pharyngeal function is normal if the bolus shows rapid, smooth, and continuous transit through the pharynx and there is no pharyngeal residue after the swallow)  reduced pharyngeal pressure generation (reduced tongue base retraction and hyolaryngeal movement) with resultant moderate vallecular residue.  A requested dry swallow aids clearance to min vallecular residue.    **Overall Severity Rating: Mild-moderate  D. LARYNGEAL PENETRATION: (Material entering into the laryngeal inlet/vestibule but not aspirated) None   E. ASPIRATION: None  F. ESOPHAGEAL PHASE: (Screening of the upper esophagus) no abnormalities within the viewable cervical esophagus  ASSESSMENT: 82 year old woman, with dementia and modified diet (mechanical soft with nectar-thick liquids), is presenting with mild oropharyngeal dysphagia.  Oral control of the bolus including oral hold and anterior to posterior transfer is within functional limits. The patient is missing some teeth having an impact on efficiency of mastication.  Timing of the pharyngeal swallow is delayed, triggering at the valleculae. There is reduced pharyngeal pressure generation (reduced tongue base retraction and hyolaryngeal movement) with resultant moderate vallecular residue.  A requested dry swallow aids  clearance to min vallecular residue.  There is no observed laryngeal penetration or tracheal aspiration of all consistencies tested.  With cup rims sips of thin liquid, there was deep laryngeal penetration  with residue on top of the vocal cords.  There was a cough response.  This study supports a mechanical soft diet with thin liquids.  She would benefit from generalizing double swallows.  If able to participate, she would benefit from exercises to improve pharyngeal pressure generation and pharyngeal clearance.  PLAN/RECOMMENDATIONS:  A. Diet:Mechanical soft with thin liquids   B. Swallowing Precautions:Encourage frequent dry swallows to promote pharyngeal clearance   C. Recommended consultation to N/A   D. Therapy recommendations: facility SLP to follow up as indicated   E. Results and recommendations were discussed with the patient immediately following the study and final report faxed or routed to referring MD and treating SLP.         G-Codes - 05/12/2015 1419    Functional Assessment Tool Used MBS, clinical judgment   Functional Limitations Swallowing   Swallow Current Status (Z6109) At least 20 percent but less than 40 percent impaired, limited or restricted   Swallow Goal Status (U0454) At least 20 percent but less than 40 percent impaired, limited or restricted   Swallow Discharge Status [phone]) At least 20 percent but less than 40 percent impaired, limited or restricted          Problem List Patient Active Problem List   Diagnosis Date Noted  . Pressure ulcer 11/28/2014  . GI bleed 11/26/2014  . GERD (gastroesophageal reflux disease) 11/26/2014  . HTN (hypertension) 11/26/2014  . A-fib (HCC) 11/26/2014  . Alzheimer's dementia 11/26/2014  . HLD (hyperlipidemia) 11/26/2014   Dollene Primrose, MS/CCC- SLP  Leandrew Koyanagi 12-May-2015, Santiago Bumpers PM  [facility] DIAGNOSTIC RADIOLOGY [address] [place], [place], [address] Phone: [phone]   Fax:     Name: Abigail Dougherty MRN: [id number] Date of Birth: 11/21/1927

## 2015-10-09 ENCOUNTER — Other Ambulatory Visit
Admission: RE | Admit: 2015-10-09 | Discharge: 2015-10-09 | Disposition: A | Discharge status: Home / Self Care | Payer: Medicare Other | Source: Skilled Nursing Facility | Attending: Family Medicine | Admitting: Family Medicine

## 2015-10-09 DIAGNOSIS — Z1389 Encounter for screening for other disorder: Secondary | ICD-10-CM | POA: Insufficient documentation

## 2015-10-10 LAB — LEGIONELLA PNEUMOPHILA SEROGP 1 UR AG: L. PNEUMOPHILA SEROGP 1 UR AG: NEGATIVE

## 2015-11-18 IMAGING — CR DG CHEST 1V PORT
1 series · 1 of 1 positions shown · non-contrast
Comparison: Same day.

CLINICAL DATA: PICC line placement.

EXAM:
PORTABLE CHEST - 1 VIEW

[ap]
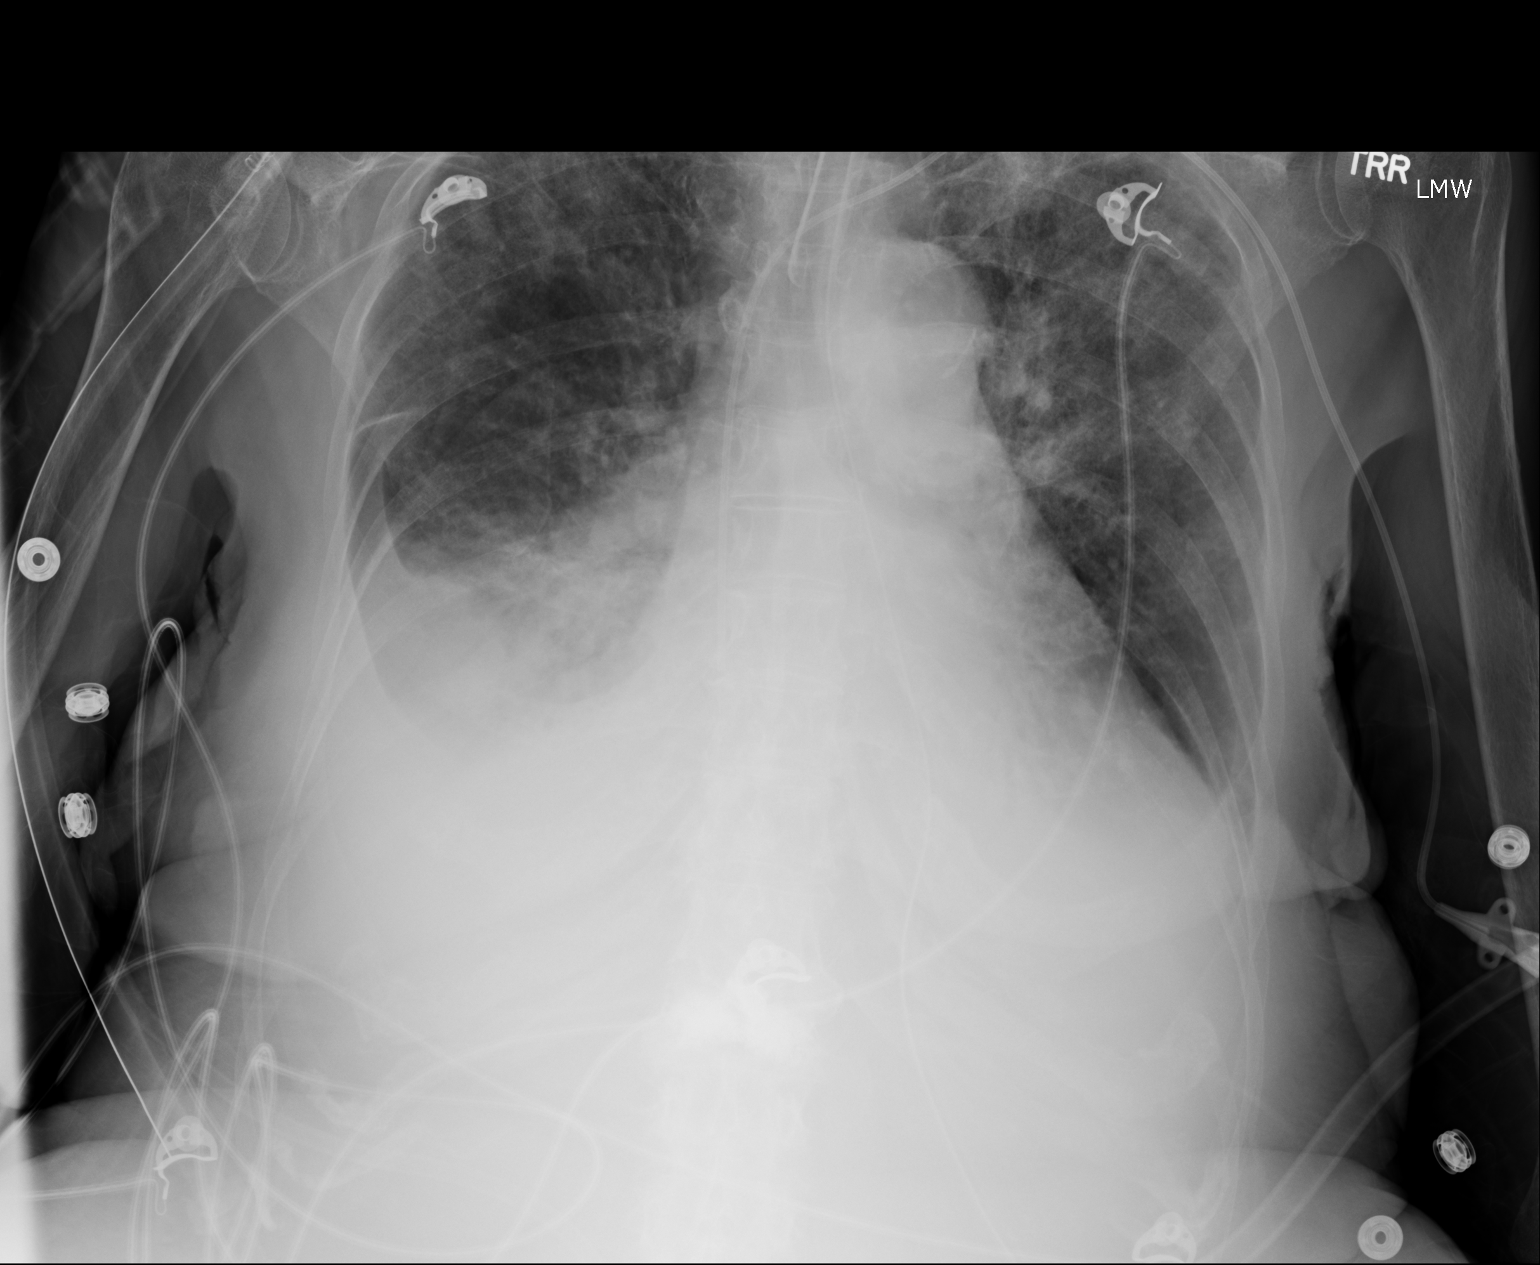

[1 of 1 positions shown; findings below may reference images not displayed]

FINDINGS: Endotracheal and nasogastric tubes are in grossly good position.
Interval placement of left-sided PICC line with distal tip in the
right atrium. Bilateral pleural effusions are noted with right
greater than left. Central pulmonary vascular congestion and
bilateral perihilar edema is noted. No pneumothorax is noted.
IMPRESSION: Stable bilateral pleural effusions and pulmonary edema compared to
prior exam. Interval placement of left-sided PICC line with distal
tip in the right atrium. Is recommended to be withdrawn at least 2-3
cm. These results will be called to the ordering clinician or
representative by the Radiologist Assistant, and communication
documented in the PACS or zVision Dashboard.

## 2015-11-18 IMAGING — CR DG CHEST 1V PORT
1 series · 1 of 1 positions shown · non-contrast
Comparison: 11/07/1948

CLINICAL DATA: Hypoxia

EXAM:
PORTABLE CHEST - 1 VIEW

[ap]
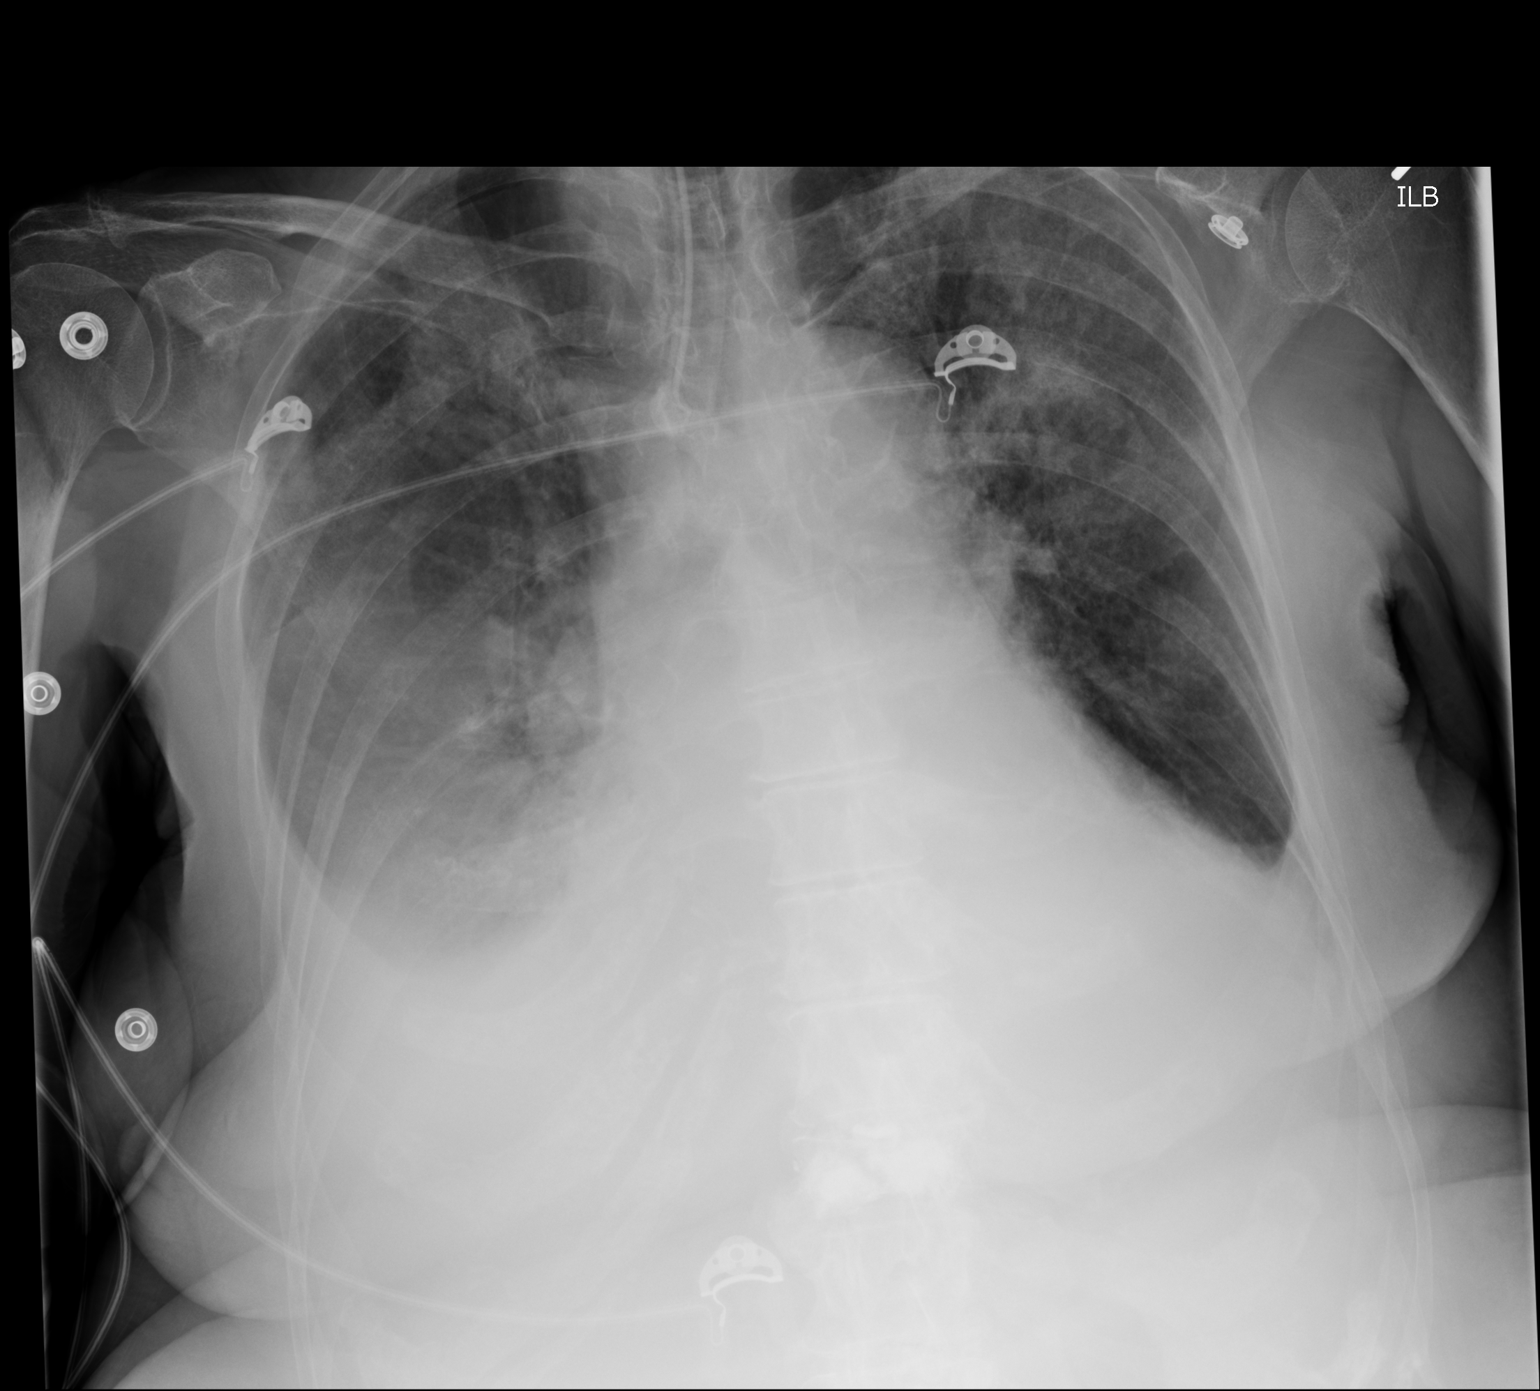

[1 of 1 positions shown; findings below may reference images not displayed]

FINDINGS: Cardiac shadow is prominent but stable. Endotracheal tube is now
seen 2.7 cm above the carina. Bilateral pleural effusions are noted
right greater than left. Patchy bilateral infiltrates are again
identified similar to that seen on the prior exam.
IMPRESSION: Status post intubation in satisfactory position. The remainder of
the exam is stable.

## 2017-06-09 ENCOUNTER — Other Ambulatory Visit: Payer: Self-pay | Admitting: Family Medicine

## 2017-06-09 DIAGNOSIS — R1312 Dysphagia, oropharyngeal phase: Secondary | ICD-10-CM

## 2017-06-29 ENCOUNTER — Ambulatory Visit: Payer: Medicare Other | Attending: Family Medicine

## 2017-07-12 ENCOUNTER — Ambulatory Visit
Admission: RE | Admit: 2017-07-12 | Discharge: 2017-07-12 | Disposition: A | Discharge status: Home / Self Care | Payer: Medicare Other | Source: Ambulatory Visit | Attending: Family Medicine | Admitting: Family Medicine

## 2017-07-12 DIAGNOSIS — R1312 Dysphagia, oropharyngeal phase: Secondary | ICD-10-CM | POA: Insufficient documentation

## 2017-07-12 NOTE — Therapy (Addendum)
[facility] DIAGNOSTIC RADIOLOGY [address] [place], [place], [address] Phone: [phone]   Fax:     Modified Barium Swallow  Patient Details  Name: Abigail Dougherty MRN: [id number] Date of Birth: 03-08-1928 No data recorded  Encounter Date: 07/12/2017  End of Session - 07/12/17 1451    Visit Number  1    Number of Visits  1    Date for SLP Re-Evaluation  07/12/17    SLP Start Time  1305    SLP Stop Time   1405    SLP Time Calculation (min)  60 min    Activity Tolerance  Patient tolerated treatment well       Past Medical History:  Diagnosis Date  . Alzheimer disease   . Atrial fibrillation   . GERD (gastroesophageal reflux disease)   . Heart failure   . Hiatal hernia   . HLD (hyperlipidemia)   . Hypertension   . Iron deficiency anemia   . Meniere disease     Past Surgical History:  Procedure Laterality Date  . MASTECTOMY      There were no vitals filed for this visit.        Subjective: Patient behavior: (alertness, ability to follow instructions, etc.): pt was awake, alert to follow along w/ po tasks. She required mod. Cues. Baseline Alzheimer's Dementia, chronic Dysphagia. Missing dentition. Previous MBSS In 05/2015 in which a mech soft w/ thin liquids diet consistency w/ aspiration precautions was recommended; noted dysphagia w/ deep laryngeal penetration of thin liquids to top of vocal cords then. Pt resides at [facility]. Chief complaint: dysphagia   Objective:  Radiological Procedure: A videoflouroscopic evaluation of oral-preparatory, reflex initiation, and pharyngeal phases of the swallow was performed; as well as a screening of the upper esophageal phase.  I. POSTURE: upright II. VIEW: lateral III. COMPENSATORY STRATEGIES: f/u, dry swallow as needed; cough/throat clear to aid protection of airway - reduced follow through w/ instruction IV. BOLUSES ADMINISTERED:  Thin Liquid: 2 trials  Nectar-thick  Liquid: 3 tsp trials, 2 cup trials  Honey-thick Liquid: 2 tsp trials  Puree: 3 trial  Mechanical Soft: 2 trials V. RESULTS OF EVALUATION: A. ORAL PREPARATORY PHASE: (The lips, tongue, and velum are observed for strength and coordination)       **Overall Severity Rating: Mild-mod. Increased time for mastication and A-P transfer; decreased bolus management of liquids w/ premature spillage to the level of the valleculae and pyriform sinuses(thins).   B. SWALLOW INITIATION/REFLEX: (The reflex is normal if "triggered" by the time the bolus reached the base of the tongue)  **Overall Severity Rating: Mod. Timing of the pharyngeal swallow is delayed w/ laryngeal penetration and aspiration(SILENT) noted w/ trials of thin liquids and Nectar liquids(via cup). When given Nectar liquids via Tsp, the management of bolus size/amount appeared adequate for pt to tolerate w/out noted penetration/aspiration during trials given. Similar noted w/ Honey liquids.  C. PHARYNGEAL PHASE: (Pharyngeal function is normal if the bolus shows rapid, smooth, and continuous transit through the pharynx and there is no pharyngeal residue after the swallow)  **Overall Severity Rating: Mild-mod. Reduced laryngeal excursion and pharyngeal pressure during the swallow resulted in pharyngeal residue - this reduced somewhat when pt completed a dry, f/u swallow(some difficulty following through w/ instruction/cues to do so).  D. LARYNGEAL PENETRATION: (Material entering into the laryngeal inlet/vestibule but not aspirated): thin liquids, Nectar liquids VIA CUP(larger bolus size amount) - SILENT E. ASPIRATION: thin liquids, Nectar liquids VIA  CUP - SILENT F. ESOPHAGEAL PHASE: (Screening of the upper esophagus): no gross Esophageal dysmotility noted in the Cervical Esophagus  ASSESSMENT: Pt appears to present w/ Moderate oropharyngeal phase dysphagia w/ SILENT laryngeal penetration and Aspiration of Thin liquids; similar difficulty w/ Nectar  liquids via Cup(larger bolus size amount). Pt does have difficulty/inconsistency w/ follow-through w/ instruction to use strategies for airway protection thus is at increased risk for Aspiration w/ potential Pulmonary impact. During the oral phase, Mild-mod. increased time for mastication and A-P transfer noted; decreased bolus management/control of liquids(thins especially) w/ premature spillage to the level of the valleculae and pyriform sinuses(thins). During the pharyngeal phase, timing of the pharyngeal swallow is delayed w/ laryngeal penetration and aspiration(SILENT) noted w/ trials of thin liquids and Nectar liquids(via Cup). When given Nectar liquids VIA Tsp, the management of bolus size/amount appeared adequate for pt to tolerate trials w/out noted penetration/aspiration during trials given. Similar noted w/ Honey liquid, purees, minced trials. Reduced laryngeal excursion and pharyngeal pressure during the swallow resulted in min+ pharyngeal residue - this reduced somewhat when pt completed a dry, f/u swallow(some difficulty following through w/ instruction/cues to do so). No gross Esophageal dysmotility noted in the Cervical Esophagus w/ trials given. It is recommended to attempt a modified po diet of Dysphagia level 2(MINCED) w/ NECTAR liquids VIA TSP to monitor bolus size of liquids - strict monitoring of pt's Pulmonary status and need to downgrade to Honey consistency liquids if poor tolerance of the Nectar liquids is suspected. Pt would benefit from careful monitoring during all oral intake/meals. Recommend Dietitian f/u for monitoring of pt's hydration needs/nutrition needs as any modified diet increases challenges in meeting needs; also to find some pleasure po's(in appropriate consistencies) for pt for quality of life.   PLAN/RECOMMENDATIONS:  A. Diet: Dysphagia level 2 (MINCED foods moistened); NECTAR consistency liquids via Cup. Pills in Puree - CRUSHED or in liquid form mixed in puree.   B.  Swallowing Precautions: strict Aspiration precautions; monitoring w/ ALL oral intake  C. Recommended consultation to: Palliative Care to discuss Goals of Care; dysphagia dx  D. Therapy recommendations: education w/ staff/pt/family re: Strict aspiration precautions; strategies at meals  E. Results and recommendations were discussed w/ pt though unsure of pt's level of comprehension/understanding d/t Dementia baseline       Patient will benefit from skilled therapeutic intervention in order to provide education to staff/pt/family   Oropharyngeal dysphagia - Plan: DG OP Swallowing Func-Medicare/Speech Path, DG OP Swallowing Func-Medicare/Speech Path        Problem List Patient Active Problem List   Diagnosis Date Noted  . Pressure ulcer 11/28/2014  . GI bleed 11/26/2014  . GERD (gastroesophageal reflux disease) 11/26/2014  . HTN (hypertension) 11/26/2014  . A-fib (HCC) 11/26/2014  . Alzheimer's dementia 11/26/2014  . HLD (hyperlipidemia) 11/26/2014      Jerilynn Som, MS, CCC-SLP Watson,Katherine 07/12/2017, 2:52 PM  [facility] DIAGNOSTIC RADIOLOGY [address] [place], [place], [address] Phone: [phone]   Fax:     Name: Abigail Dougherty MRN: [id number] Date of Birth: 1928-02-20

## 2017-11-06 DEATH — deceased
